# Patient Record
Sex: Female | Born: 1935
Health system: Southern US, Community
[De-identification: ages and names within clinical notes are randomized; demographics above are authoritative.]

## PROBLEM LIST (undated history)

## (undated) DIAGNOSIS — E0500 Thyrotoxicosis with diffuse goiter without thyrotoxic crisis or storm: Secondary | ICD-10-CM

## (undated) DIAGNOSIS — E039 Hypothyroidism, unspecified: Secondary | ICD-10-CM

## (undated) DIAGNOSIS — K219 Gastro-esophageal reflux disease without esophagitis: Secondary | ICD-10-CM

## (undated) DIAGNOSIS — I4891 Unspecified atrial fibrillation: Secondary | ICD-10-CM

## (undated) DIAGNOSIS — N200 Calculus of kidney: Secondary | ICD-10-CM

## (undated) HISTORY — PX: TOTAL ABDOMINAL HYSTERECTOMY: SHX209

## (undated) HISTORY — DX: Hypothyroidism, unspecified: E03.9

## (undated) HISTORY — DX: Thyrotoxicosis with diffuse goiter without thyrotoxic crisis or storm: E05.00

## (undated) HISTORY — DX: Gastro-esophageal reflux disease without esophagitis: K21.9

---

## 2005-09-22 ENCOUNTER — Ambulatory Visit: Payer: Self-pay | Admitting: Cardiology

## 2015-03-18 DIAGNOSIS — I10 Essential (primary) hypertension: Secondary | ICD-10-CM | POA: Diagnosis not present

## 2015-03-18 DIAGNOSIS — M7989 Other specified soft tissue disorders: Secondary | ICD-10-CM | POA: Diagnosis not present

## 2015-03-18 DIAGNOSIS — M810 Age-related osteoporosis without current pathological fracture: Secondary | ICD-10-CM | POA: Diagnosis not present

## 2015-03-18 DIAGNOSIS — E7800 Pure hypercholesterolemia, unspecified: Secondary | ICD-10-CM | POA: Diagnosis not present

## 2015-06-20 DIAGNOSIS — Z7189 Other specified counseling: Secondary | ICD-10-CM | POA: Diagnosis not present

## 2015-06-20 DIAGNOSIS — Z79899 Other long term (current) drug therapy: Secondary | ICD-10-CM | POA: Diagnosis not present

## 2015-06-20 DIAGNOSIS — E7800 Pure hypercholesterolemia, unspecified: Secondary | ICD-10-CM | POA: Diagnosis not present

## 2015-06-20 DIAGNOSIS — R5383 Other fatigue: Secondary | ICD-10-CM | POA: Diagnosis not present

## 2015-06-20 DIAGNOSIS — Z6823 Body mass index (BMI) 23.0-23.9, adult: Secondary | ICD-10-CM | POA: Diagnosis not present

## 2015-06-20 DIAGNOSIS — E039 Hypothyroidism, unspecified: Secondary | ICD-10-CM | POA: Diagnosis not present

## 2015-06-20 DIAGNOSIS — Z1389 Encounter for screening for other disorder: Secondary | ICD-10-CM | POA: Diagnosis not present

## 2015-06-20 DIAGNOSIS — Z1211 Encounter for screening for malignant neoplasm of colon: Secondary | ICD-10-CM | POA: Diagnosis not present

## 2015-06-20 DIAGNOSIS — Z0000 Encounter for general adult medical examination without abnormal findings: Secondary | ICD-10-CM | POA: Diagnosis not present

## 2015-06-20 DIAGNOSIS — Z299 Encounter for prophylactic measures, unspecified: Secondary | ICD-10-CM | POA: Diagnosis not present

## 2015-06-24 ENCOUNTER — Encounter (INDEPENDENT_AMBULATORY_CARE_PROVIDER_SITE_OTHER): Payer: Self-pay | Admitting: *Deleted

## 2015-07-17 ENCOUNTER — Encounter (INDEPENDENT_AMBULATORY_CARE_PROVIDER_SITE_OTHER): Payer: Self-pay | Admitting: Internal Medicine

## 2015-07-17 ENCOUNTER — Ambulatory Visit (INDEPENDENT_AMBULATORY_CARE_PROVIDER_SITE_OTHER): Payer: Medicare Other | Admitting: Internal Medicine

## 2015-07-17 ENCOUNTER — Encounter (INDEPENDENT_AMBULATORY_CARE_PROVIDER_SITE_OTHER): Payer: Self-pay

## 2015-07-17 VITALS — BP 112/80 | HR 60 | Temp 98.0°F | Ht 62.5 in | Wt 123.7 lb

## 2015-07-17 DIAGNOSIS — E0500 Thyrotoxicosis with diffuse goiter without thyrotoxic crisis or storm: Secondary | ICD-10-CM | POA: Insufficient documentation

## 2015-07-17 DIAGNOSIS — K219 Gastro-esophageal reflux disease without esophagitis: Secondary | ICD-10-CM | POA: Insufficient documentation

## 2015-07-17 DIAGNOSIS — R1310 Dysphagia, unspecified: Secondary | ICD-10-CM

## 2015-07-17 MED ORDER — OMEPRAZOLE 40 MG PO CPDR: 40.0000 mg | DELAYED_RELEASE_CAPSULE | Freq: Every day | ORAL | Status: DC

## 2015-07-17 NOTE — Patient Instructions (Addendum)
DG esophagram.  GERD diet given to patient
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
## 2015-07-19 ENCOUNTER — Encounter (INDEPENDENT_AMBULATORY_CARE_PROVIDER_SITE_OTHER): Payer: Self-pay

## 2015-07-19 DIAGNOSIS — K219 Gastro-esophageal reflux disease without esophagitis: Secondary | ICD-10-CM | POA: Diagnosis not present

## 2015-07-19 DIAGNOSIS — R1310 Dysphagia, unspecified: Secondary | ICD-10-CM | POA: Diagnosis not present

## 2015-07-19 DIAGNOSIS — K449 Diaphragmatic hernia without obstruction or gangrene: Secondary | ICD-10-CM | POA: Diagnosis not present

## 2015-08-06 ENCOUNTER — Other Ambulatory Visit (INDEPENDENT_AMBULATORY_CARE_PROVIDER_SITE_OTHER): Payer: Self-pay | Admitting: Internal Medicine

## 2015-08-06 ENCOUNTER — Telehealth (INDEPENDENT_AMBULATORY_CARE_PROVIDER_SITE_OTHER): Payer: Self-pay | Admitting: Internal Medicine

## 2015-08-06 ENCOUNTER — Encounter (INDEPENDENT_AMBULATORY_CARE_PROVIDER_SITE_OTHER): Payer: Self-pay | Admitting: *Deleted

## 2015-08-06 DIAGNOSIS — K219 Gastro-esophageal reflux disease without esophagitis: Secondary | ICD-10-CM

## 2015-08-06 NOTE — Telephone Encounter (Signed)
Still burning in her esophagus. Normal Esophram.

## 2015-08-06 NOTE — Telephone Encounter (Signed)
EGD sch'd 08/23/15, left detailed message for patient, instructions mailed

## 2015-08-06 NOTE — Telephone Encounter (Signed)
Need EGD

## 2015-08-13 ENCOUNTER — Encounter (INDEPENDENT_AMBULATORY_CARE_PROVIDER_SITE_OTHER): Payer: Self-pay

## 2015-08-23 ENCOUNTER — Encounter (HOSPITAL_COMMUNITY): Payer: Self-pay | Admitting: *Deleted

## 2015-08-23 ENCOUNTER — Encounter (HOSPITAL_COMMUNITY)
Admission: RE | Disposition: A | Discharge status: Home / Self Care | Payer: Self-pay | Source: Ambulatory Visit | Attending: Internal Medicine

## 2015-08-23 ENCOUNTER — Ambulatory Visit (HOSPITAL_COMMUNITY)
Admission: RE | Admit: 2015-08-23 | Discharge: 2015-08-23 | Disposition: A | Discharge status: Home / Self Care | Payer: Medicare Other | Source: Ambulatory Visit | Attending: Internal Medicine | Admitting: Internal Medicine

## 2015-08-23 DIAGNOSIS — E0500 Thyrotoxicosis with diffuse goiter without thyrotoxic crisis or storm: Secondary | ICD-10-CM | POA: Insufficient documentation

## 2015-08-23 DIAGNOSIS — Z880 Allergy status to penicillin: Secondary | ICD-10-CM | POA: Insufficient documentation

## 2015-08-23 DIAGNOSIS — Z90710 Acquired absence of both cervix and uterus: Secondary | ICD-10-CM | POA: Insufficient documentation

## 2015-08-23 DIAGNOSIS — K219 Gastro-esophageal reflux disease without esophagitis: Secondary | ICD-10-CM

## 2015-08-23 DIAGNOSIS — K210 Gastro-esophageal reflux disease with esophagitis: Secondary | ICD-10-CM | POA: Diagnosis not present

## 2015-08-23 DIAGNOSIS — R1310 Dysphagia, unspecified: Secondary | ICD-10-CM | POA: Insufficient documentation

## 2015-08-23 DIAGNOSIS — K449 Diaphragmatic hernia without obstruction or gangrene: Secondary | ICD-10-CM | POA: Diagnosis not present

## 2015-08-23 DIAGNOSIS — Z87442 Personal history of urinary calculi: Secondary | ICD-10-CM | POA: Diagnosis not present

## 2015-08-23 DIAGNOSIS — E039 Hypothyroidism, unspecified: Secondary | ICD-10-CM | POA: Insufficient documentation

## 2015-08-23 DIAGNOSIS — Z79899 Other long term (current) drug therapy: Secondary | ICD-10-CM | POA: Diagnosis not present

## 2015-08-23 DIAGNOSIS — Z882 Allergy status to sulfonamides status: Secondary | ICD-10-CM | POA: Diagnosis not present

## 2015-08-23 DIAGNOSIS — K317 Polyp of stomach and duodenum: Secondary | ICD-10-CM | POA: Diagnosis not present

## 2015-08-23 HISTORY — DX: Calculus of kidney: N20.0

## 2015-08-23 HISTORY — PX: ESOPHAGOGASTRODUODENOSCOPY: SHX5428

## 2015-08-23 SURGERY — EGD (ESOPHAGOGASTRODUODENOSCOPY)
Anesthesia: Moderate Sedation

## 2015-08-23 MED ORDER — MEPERIDINE HCL 50 MG/ML IJ SOLN
INTRAMUSCULAR | Status: DC | PRN
  Administered 2015-08-23 (×2): 25 mg via INTRAVENOUS

## 2015-08-23 MED ORDER — BUTAMBEN-TETRACAINE-BENZOCAINE 2-2-14 % EX AERO
INHALATION_SPRAY | CUTANEOUS | Status: DC | PRN
  Administered 2015-08-23: 2 via TOPICAL

## 2015-08-23 MED ORDER — MIDAZOLAM HCL 5 MG/5ML IJ SOLN
INTRAMUSCULAR | Status: DC | PRN
  Administered 2015-08-23: 1 mg via INTRAVENOUS
  Administered 2015-08-23: 2 mg via INTRAVENOUS
  Administered 2015-08-23 (×2): 1 mg via INTRAVENOUS

## 2015-08-23 MED ORDER — SODIUM CHLORIDE 0.9 % IV SOLN
INTRAVENOUS | Status: DC
  Administered 2015-08-23: 14:00:00 via INTRAVENOUS

## 2015-08-23 MED ORDER — MIDAZOLAM HCL 5 MG/5ML IJ SOLN
INTRAMUSCULAR | Status: AC
  Filled 2015-08-23: qty 10

## 2015-08-23 MED ORDER — PANTOPRAZOLE SODIUM 40 MG PO TBEC: 40.0000 mg | DELAYED_RELEASE_TABLET | Freq: Two times a day (BID) | ORAL | Status: DC

## 2015-08-23 MED ORDER — STERILE WATER FOR IRRIGATION IR SOLN
Status: DC | PRN
  Administered 2015-08-23: 16:00:00

## 2015-08-23 MED ORDER — MEPERIDINE HCL 50 MG/ML IJ SOLN
INTRAMUSCULAR | Status: AC
  Filled 2015-08-23: qty 1

## 2015-08-23 NOTE — H&P (Signed)
Jon GillsJean R Bueso is an 81 y.o. female.   Chief Complaint: Patient is here for EGD and possible EGD. HPI: She does 81 year old Caucasian female was at more than 5 year history of symptoms of GERD and has been using OTC medications until a few months ago when she started this Nexium. She is still having intermittent heartburn she also has dysphagia with solids. She denies nausea vomiting abdominal pain or melena. She has good appetite. She had barium study on 07/19/2015 and no abnormality was identified.  Past Medical History  Diagnosis Date  . GERD (gastroesophageal reflux disease)   . Graves disease   . Hypothyroid   . Kidney stones     Past Surgical History  Procedure Laterality Date  . Total abdominal hysterectomy      History reviewed. No pertinent family history. Social History:  reports that she has never smoked. She does not have any smokeless tobacco history on file. She reports that she does not drink alcohol or use illicit drugs.  Allergies:  Allergies  Allergen Reactions  . Penicillins     Rash, arm swelled  . Sulfa Antibiotics     Rash     Medications Prior to Admission  Medication Sig Dispense Refill  . calcium carbonate (OS-CAL) 1250 (500 Ca) MG chewable tablet Chew 1 tablet by mouth daily.    Marland Kitchen. levothyroxine (SYNTHROID) 75 MCG tablet Take 75 mcg by mouth daily before breakfast.    . multivitamin-iron-minerals-folic acid (CENTRUM) chewable tablet Chew 1 tablet by mouth daily.    Marland Kitchen. omeprazole (PRILOSEC) 40 MG capsule Take 1 capsule (40 mg total) by mouth daily. 90 capsule 3  . esomeprazole (NEXIUM) 20 MG capsule Take 22.3 mg by mouth as needed.      No results found for this or any previous visit (from the past 48 hour(s)). No results found.  ROS  Blood pressure 180/85, pulse 80, temperature 98.2 F (36.8 C), temperature source Oral, resp. rate 23, height 5' 2.5" (1.588 m), weight 124 lb (56.246 kg), SpO2 99 %. Physical Exam  Constitutional: She appears  well-developed and well-nourished.  HENT:  Mouth/Throat: Oropharynx is clear and moist.  Eyes: Conjunctivae are normal. No scleral icterus.  Neck: No thyromegaly present.  Cardiovascular: Normal rate, regular rhythm and normal heart sounds.   No murmur heard. Respiratory: Effort normal and breath sounds normal.  GI: Soft. She exhibits no distension and no mass. There is no tenderness.  Musculoskeletal: She exhibits no edema.  Lymphadenopathy:    She has no cervical adenopathy.  Neurological: She is alert.  Skin: Skin is warm.     Assessment/Plan Chronic GERD with recent flareup of her symptoms and solid food dysphagia. EGD and possible ED.  Lionel DecemberNajeeb Brittni Hult, MD 08/23/2015, 3:27 PM

## 2015-08-23 NOTE — Op Note (Signed)
[facility] Patient Name: Bethany CradleJean Badgett Procedure Date: 08/23/2015 3:23 PM MRN: [id number] Date of Birth: [birth year] Attending MD: Lionel DecemberNajeeb Mercia Dowe , MD CSN: [id number] Age: 81 Admit Type: Outpatient Procedure:                Upper GI endoscopy Indications:              Suspected gastro-esophageal reflux disease, Failure                            to respond to medical treatment Providers:                Lionel DecemberNajeeb Tahje Borawski, MD, Loma MessingLurae B. Patsy LagerAlbert RN, RN, Marlene Lardaylor                            B. Lemons, Technician Referring MD:             Ignatius Speckinghruv B. Vyas, MD Medicines:                Cetacaine spray, Meperidine 50 mg IV, Midazolam 5                            mg IV Complications:            No immediate complications. Estimated Blood Loss:     Estimated blood loss was minimal. Procedure:                Pre-Anesthesia Assessment:                           - Prior to the procedure, a History and Physical                            was performed, and patient medications and                            allergies were reviewed. The patient's tolerance of                            previous anesthesia was also reviewed. The risks                            and benefits of the procedure and the sedation                            options and risks were discussed with the patient.                            All questions were answered, and informed consent                            was obtained. Prior Anticoagulants: The patient has                            taken no previous anticoagulant or antiplatelet  agents. ASA Grade Assessment: II - A patient with                            mild systemic disease. After reviewing the risks                            and benefits, the patient was deemed in                            satisfactory condition to undergo the procedure.                           After obtaining informed consent, the endoscope was                             passed under direct vision. Throughout the                            procedure, the patient's blood pressure, pulse, and                            oxygen saturations were monitored continuously. The                            [id number] ([id number] scope was introduced through the                            mouth, and advanced to the second part of duodenum.                            The upper GI endoscopy was accomplished without                            difficulty. The patient tolerated the procedure                            well. Scope In: 3:37:50 PM Scope Out: 3:50:49 PM Total Procedure Duration: 0 hours 12 minutes 59 seconds  Findings:      The upper third of the esophagus and middle third of the esophagus were       normal.      LA Grade B (one or more mucosal breaks greater than 5 mm, not extending       between the tops of two mucosal folds) esophagitis with no bleeding was       found 31 to 33 cm from the incisors.      The Z-line was irregular and was found 33 cm from the incisors. Biopsies       were taken with a cold forceps for histology.      Small sliding hiatal hernia.      A few small sessile polyps with no bleeding and no stigmata of recent       bleeding were found in the gastric fundus and in the gastric body.       Biopsies were taken with a cold forceps for histology.  The exam of the stomach was otherwise normal.      The duodenal bulb and second portion of the duodenum were normal. Impression:               - Normal upper third of esophagus and middle third                            of esophagus.                           - LA Grade B reflux esophagitis.                           - Z-line irregular, 33 cm from the incisors.                            Biopsied.                           - Small sliding hiatal hernia.                           - A few gastric polyps. Biopsied.                           - Normal duodenal bulb and second portion of the                             duodenum. Moderate Sedation:      Moderate (conscious) sedation was administered by the endoscopy nurse       and supervised by the endoscopist. The following parameters were       monitored: oxygen saturation, heart rate, blood pressure, CO2       capnography and response to care. Total physician intraservice time was       18 minutes. Recommendation:           - Patient has a contact number available for                            emergencies. The signs and symptoms of potential                            delayed complications were discussed with the                            patient. Return to normal activities tomorrow.                            Written discharge instructions were provided to the                            patient.                           - Resume previous diet today.                           -  Continue present medications.                           - Use Protonix (pantoprazole) 40 mg PO BID today.                           - Await pathology results.                           - Return to GI office in 2 months. Procedure Code(s):        --- Professional ---                           [phone], Esophagogastroduodenoscopy, flexible,                            transoral; with biopsy, single or multiple                           99152, Moderate sedation services provided by the                            same physician or other qualified health care                            professional performing the diagnostic or                            therapeutic service that the sedation supports,                            requiring the presence of an independent trained                            observer to assist in the monitoring of the                            patient's level of consciousness and physiological                            status; initial 15 minutes of intraservice time,                            patient age 32 years or  older Diagnosis Code(s):        --- Professional ---                           K21.0, Gastro-esophageal reflux disease with                            esophagitis                           K22.8, Other specified diseases of esophagus  K31.7, Polyp of stomach and duodenum CPT copyright 2016 American Medical Association. All rights reserved. The codes documented in this report are preliminary and upon coder review may  be revised to meet current compliance requirements. Lionel December, MD Lionel December, MD 08/23/2015 4:02:07 PM This report has been signed electronically. Number of Addenda: 0

## 2015-08-23 NOTE — Discharge Instructions (Signed)
Discontinue Nexium. Pantoprazole 40 mg by mouth 30 minutes before breakfast and evening meal daily. Resume other medications and diet as before. No driving for 24 hours. Physician will call with biopsy results. Office visit in 2 months.     Esophagogastroduodenoscopy, Care After Refer to this sheet in the next few weeks. These instructions provide you with information about caring for yourself after your procedure. Your health care provider may also give you more specific instructions. Your treatment has been planned according to current medical practices, but problems sometimes occur. Call your health care provider if you have any problems or questions after your procedure. WHAT TO EXPECT AFTER THE PROCEDURE After your procedure, it is typical to feel:  Soreness in your throat.  Pain with swallowing.  Sick to your stomach (nauseous).  Bloated.  Dizzy.  Fatigued. HOME CARE INSTRUCTIONS  Do not eat or drink anything until the numbing medicine (local anesthetic) has worn off and your gag reflex has returned. You will know that the local anesthetic has worn off when you can swallow comfortably.  Do not drive or operate machinery until directed by your health care provider.  Take medicines only as directed by your health care provider. SEEK MEDICAL CARE IF:   You cannot stop coughing.  You are not urinating at all or less than usual. SEEK IMMEDIATE MEDICAL CARE IF:  You have difficulty swallowing.  You cannot eat or drink.  You have worsening throat or chest pain.  You have dizziness or lightheadedness or you faint.  You have nausea or vomiting.  You have chills.  You have a fever.  You have severe abdominal pain.  You have black, tarry, or bloody stools.   This information is not intended to replace advice given to you by your health care provider. Make sure you discuss any questions you have with your health care provider.   Document Released: 01/20/2012  Document Revised: 02/23/2014 Document Reviewed: 01/20/2012 Elsevier Interactive Patient Education [employer]2016 Elsevier Inc.

## 2015-08-26 ENCOUNTER — Encounter (INDEPENDENT_AMBULATORY_CARE_PROVIDER_SITE_OTHER): Payer: Self-pay | Admitting: Internal Medicine

## 2015-08-29 ENCOUNTER — Encounter (HOSPITAL_COMMUNITY): Payer: Self-pay | Admitting: Internal Medicine

## 2015-09-30 DIAGNOSIS — H40053 Ocular hypertension, bilateral: Secondary | ICD-10-CM | POA: Diagnosis not present

## 2015-10-10 DIAGNOSIS — H2513 Age-related nuclear cataract, bilateral: Secondary | ICD-10-CM | POA: Diagnosis not present

## 2015-10-10 DIAGNOSIS — H25013 Cortical age-related cataract, bilateral: Secondary | ICD-10-CM | POA: Diagnosis not present

## 2015-10-10 DIAGNOSIS — H2512 Age-related nuclear cataract, left eye: Secondary | ICD-10-CM | POA: Diagnosis not present

## 2015-10-10 DIAGNOSIS — H25012 Cortical age-related cataract, left eye: Secondary | ICD-10-CM | POA: Diagnosis not present

## 2015-10-10 DIAGNOSIS — H35363 Drusen (degenerative) of macula, bilateral: Secondary | ICD-10-CM | POA: Diagnosis not present

## 2015-10-10 DIAGNOSIS — H35033 Hypertensive retinopathy, bilateral: Secondary | ICD-10-CM | POA: Diagnosis not present

## 2015-10-28 ENCOUNTER — Encounter (INDEPENDENT_AMBULATORY_CARE_PROVIDER_SITE_OTHER): Payer: Self-pay | Admitting: Internal Medicine

## 2015-10-28 ENCOUNTER — Ambulatory Visit (INDEPENDENT_AMBULATORY_CARE_PROVIDER_SITE_OTHER): Payer: Medicare Other | Admitting: Internal Medicine

## 2015-10-28 VITALS — BP 160/68 | HR 72 | Temp 98.0°F | Ht 62.5 in | Wt 124.8 lb

## 2015-10-28 DIAGNOSIS — K2100 Gastro-esophageal reflux disease with esophagitis, without bleeding: Secondary | ICD-10-CM

## 2015-10-28 NOTE — Patient Instructions (Signed)
Continue Protonix twice a day. OV in 1year.

## 2015-10-28 NOTE — Progress Notes (Signed)
Subjective:    Patient ID: Bethany GillsJean R Farinas, female    DOB: [birth year], 81 y.o.   MRN: [id number]  HPI Here today for f/u after recent EGD in July for GERD. She tells me she is doing good. The Protonix has helped. She is taking BID. Appetite is good. No weight loss. She has a BM daily. No melena or BRRB. Last colonoscopy in 2010 for family hx of colon cancer (sister in her 5680s).  She has declined colonoscopy today. She is going to have cataract surgery and then she will let us know when she is ready.   08/23/2015 EGD: GERD Impression:               - Normal upper third of esophagus and middle third                            of esophagus.                           - LA Grade B reflux esophagitis.                           - Z-line irregular, 33 cm from the incisors.                            Biopsied.                           - Small sliding hiatal hernia.                           - A few gastric polyps. Biopsied.                           - Normal duodenal bulb and second portion of the                            duodenum. Moderate Sedation:  Review of Systems Past Medical History:  Diagnosis Date  . GERD (gastroesophageal reflux disease)   . Graves disease   . Hypothyroid   . Kidney stones     Past Surgical History:  Procedure Laterality Date  . ESOPHAGOGASTRODUODENOSCOPY N/A 08/23/2015   Procedure: ESOPHAGOGASTRODUODENOSCOPY (EGD);  Surgeon: Malissa HippoNajeeb U Rehman, MD;  Location: [facility];  Service: Endoscopy;  Laterality: N/A;  235-moved to 220 Ann to notify pt  . TOTAL ABDOMINAL HYSTERECTOMY      Allergies  Allergen Reactions  . Penicillins     Rash, arm swelled  . Sulfa Antibiotics     Rash     Current Outpatient Prescriptions on File Prior to Visit  Medication Sig Dispense Refill  . calcium carbonate (OS-CAL) 1250 (500 Ca) MG chewable tablet Chew 1 tablet by mouth daily.    Marland Kitchen. levothyroxine (SYNTHROID) 75 MCG tablet Take 75 mcg by mouth daily before breakfast.     . multivitamin-iron-minerals-folic acid (CENTRUM) chewable tablet Chew 1 tablet by mouth daily.    . pantoprazole (PROTONIX) 40 MG tablet Take 1 tablet (40 mg total) by mouth 2 (two) times daily before a meal. 60 tablet 2   No current facility-administered medications on file prior to visit.  Objective:   Physical Exam  Alert and oriented. Skin warm and dry. Oral mucosa is moist.   . Sclera anicteric, conjunctivae is pink. Thyroid not enlarged. No cervical lymphadenopathy. Lungs clear. Heart regular rate and rhythm.  Abdomen is soft. Bowel sounds are positive. No hepatomegaly. No abdominal masses felt. No tenderness.  No edema to lower extremities. P .      Assessment & Plan:  GERD with Esophagitis. Continue the Protonix BID.  OV in 1 year.

## 2015-11-05 DIAGNOSIS — H25812 Combined forms of age-related cataract, left eye: Secondary | ICD-10-CM | POA: Diagnosis not present

## 2015-11-05 DIAGNOSIS — H2512 Age-related nuclear cataract, left eye: Secondary | ICD-10-CM | POA: Diagnosis not present

## 2015-11-05 DIAGNOSIS — H25012 Cortical age-related cataract, left eye: Secondary | ICD-10-CM | POA: Diagnosis not present

## 2015-11-27 DIAGNOSIS — H2511 Age-related nuclear cataract, right eye: Secondary | ICD-10-CM | POA: Diagnosis not present

## 2015-11-27 DIAGNOSIS — H25011 Cortical age-related cataract, right eye: Secondary | ICD-10-CM | POA: Diagnosis not present

## 2015-12-03 DIAGNOSIS — H2511 Age-related nuclear cataract, right eye: Secondary | ICD-10-CM | POA: Diagnosis not present

## 2015-12-03 DIAGNOSIS — H25011 Cortical age-related cataract, right eye: Secondary | ICD-10-CM | POA: Diagnosis not present

## 2015-12-03 DIAGNOSIS — H25811 Combined forms of age-related cataract, right eye: Secondary | ICD-10-CM | POA: Diagnosis not present

## 2015-12-05 DIAGNOSIS — H5989 Other postprocedural complications and disorders of eye and adnexa, not elsewhere classified: Secondary | ICD-10-CM | POA: Diagnosis not present

## 2015-12-05 DIAGNOSIS — H2000 Unspecified acute and subacute iridocyclitis: Secondary | ICD-10-CM | POA: Diagnosis not present

## 2015-12-07 DIAGNOSIS — H2000 Unspecified acute and subacute iridocyclitis: Secondary | ICD-10-CM | POA: Diagnosis not present

## 2015-12-07 DIAGNOSIS — Z961 Presence of intraocular lens: Secondary | ICD-10-CM | POA: Diagnosis not present

## 2015-12-07 DIAGNOSIS — H5989 Other postprocedural complications and disorders of eye and adnexa, not elsewhere classified: Secondary | ICD-10-CM | POA: Diagnosis not present

## 2015-12-12 DIAGNOSIS — H44001 Unspecified purulent endophthalmitis, right eye: Secondary | ICD-10-CM | POA: Diagnosis not present

## 2015-12-12 DIAGNOSIS — H44011 Panophthalmitis (acute), right eye: Secondary | ICD-10-CM | POA: Diagnosis not present

## 2015-12-12 DIAGNOSIS — H2000 Unspecified acute and subacute iridocyclitis: Secondary | ICD-10-CM | POA: Diagnosis not present

## 2015-12-12 DIAGNOSIS — Z961 Presence of intraocular lens: Secondary | ICD-10-CM | POA: Diagnosis not present

## 2015-12-12 DIAGNOSIS — H5989 Other postprocedural complications and disorders of eye and adnexa, not elsewhere classified: Secondary | ICD-10-CM | POA: Diagnosis not present

## 2016-07-23 DIAGNOSIS — K219 Gastro-esophageal reflux disease without esophagitis: Secondary | ICD-10-CM | POA: Diagnosis not present

## 2016-07-23 DIAGNOSIS — R609 Edema, unspecified: Secondary | ICD-10-CM | POA: Diagnosis not present

## 2016-07-23 DIAGNOSIS — Z0000 Encounter for general adult medical examination without abnormal findings: Secondary | ICD-10-CM | POA: Diagnosis not present

## 2016-07-23 DIAGNOSIS — Z1211 Encounter for screening for malignant neoplasm of colon: Secondary | ICD-10-CM | POA: Diagnosis not present

## 2016-07-23 DIAGNOSIS — Z7189 Other specified counseling: Secondary | ICD-10-CM | POA: Diagnosis not present

## 2016-07-23 DIAGNOSIS — Z1389 Encounter for screening for other disorder: Secondary | ICD-10-CM | POA: Diagnosis not present

## 2016-07-23 DIAGNOSIS — Z79899 Other long term (current) drug therapy: Secondary | ICD-10-CM | POA: Diagnosis not present

## 2016-07-23 DIAGNOSIS — E039 Hypothyroidism, unspecified: Secondary | ICD-10-CM | POA: Diagnosis not present

## 2016-07-23 DIAGNOSIS — E663 Overweight: Secondary | ICD-10-CM | POA: Diagnosis not present

## 2016-07-23 DIAGNOSIS — Z299 Encounter for prophylactic measures, unspecified: Secondary | ICD-10-CM | POA: Diagnosis not present

## 2016-07-23 DIAGNOSIS — I10 Essential (primary) hypertension: Secondary | ICD-10-CM | POA: Diagnosis not present

## 2016-07-23 DIAGNOSIS — E7800 Pure hypercholesterolemia, unspecified: Secondary | ICD-10-CM | POA: Diagnosis not present

## 2016-08-13 DIAGNOSIS — E2839 Other primary ovarian failure: Secondary | ICD-10-CM | POA: Diagnosis not present

## 2016-08-24 DIAGNOSIS — E663 Overweight: Secondary | ICD-10-CM | POA: Diagnosis not present

## 2016-08-24 DIAGNOSIS — E039 Hypothyroidism, unspecified: Secondary | ICD-10-CM | POA: Diagnosis not present

## 2016-08-24 DIAGNOSIS — I10 Essential (primary) hypertension: Secondary | ICD-10-CM | POA: Diagnosis not present

## 2016-08-24 DIAGNOSIS — Z299 Encounter for prophylactic measures, unspecified: Secondary | ICD-10-CM | POA: Diagnosis not present

## 2016-08-24 DIAGNOSIS — L0390 Cellulitis, unspecified: Secondary | ICD-10-CM | POA: Diagnosis not present

## 2016-08-24 DIAGNOSIS — E7800 Pure hypercholesterolemia, unspecified: Secondary | ICD-10-CM | POA: Diagnosis not present

## 2016-09-08 DIAGNOSIS — E039 Hypothyroidism, unspecified: Secondary | ICD-10-CM | POA: Diagnosis not present

## 2016-09-08 DIAGNOSIS — L0390 Cellulitis, unspecified: Secondary | ICD-10-CM | POA: Diagnosis not present

## 2016-09-08 DIAGNOSIS — Z299 Encounter for prophylactic measures, unspecified: Secondary | ICD-10-CM | POA: Diagnosis not present

## 2016-09-08 DIAGNOSIS — E7800 Pure hypercholesterolemia, unspecified: Secondary | ICD-10-CM | POA: Diagnosis not present

## 2016-09-08 DIAGNOSIS — E663 Overweight: Secondary | ICD-10-CM | POA: Diagnosis not present

## 2016-09-08 DIAGNOSIS — I10 Essential (primary) hypertension: Secondary | ICD-10-CM | POA: Diagnosis not present

## 2016-09-14 DIAGNOSIS — M79606 Pain in leg, unspecified: Secondary | ICD-10-CM | POA: Diagnosis not present

## 2016-09-14 DIAGNOSIS — M79605 Pain in left leg: Secondary | ICD-10-CM | POA: Diagnosis not present

## 2016-09-22 DIAGNOSIS — Z299 Encounter for prophylactic measures, unspecified: Secondary | ICD-10-CM | POA: Diagnosis not present

## 2016-09-22 DIAGNOSIS — L03119 Cellulitis of unspecified part of limb: Secondary | ICD-10-CM | POA: Diagnosis not present

## 2016-09-22 DIAGNOSIS — Z6826 Body mass index (BMI) 26.0-26.9, adult: Secondary | ICD-10-CM | POA: Diagnosis not present

## 2016-09-22 DIAGNOSIS — E7800 Pure hypercholesterolemia, unspecified: Secondary | ICD-10-CM | POA: Diagnosis not present

## 2016-09-22 DIAGNOSIS — E039 Hypothyroidism, unspecified: Secondary | ICD-10-CM | POA: Diagnosis not present

## 2016-09-22 DIAGNOSIS — I10 Essential (primary) hypertension: Secondary | ICD-10-CM | POA: Diagnosis not present

## 2016-10-13 ENCOUNTER — Encounter (INDEPENDENT_AMBULATORY_CARE_PROVIDER_SITE_OTHER): Payer: Self-pay | Admitting: Internal Medicine

## 2016-10-27 ENCOUNTER — Ambulatory Visit (INDEPENDENT_AMBULATORY_CARE_PROVIDER_SITE_OTHER): Payer: Medicare Other | Admitting: Internal Medicine

## 2016-10-27 DIAGNOSIS — L97829 Non-pressure chronic ulcer of other part of left lower leg with unspecified severity: Secondary | ICD-10-CM | POA: Diagnosis not present

## 2016-10-27 DIAGNOSIS — I83028 Varicose veins of left lower extremity with ulcer other part of lower leg: Secondary | ICD-10-CM | POA: Diagnosis not present

## 2016-10-27 DIAGNOSIS — Z79899 Other long term (current) drug therapy: Secondary | ICD-10-CM | POA: Diagnosis not present

## 2016-10-27 DIAGNOSIS — S8992XA Unspecified injury of left lower leg, initial encounter: Secondary | ICD-10-CM | POA: Diagnosis not present

## 2016-10-27 DIAGNOSIS — I83022 Varicose veins of left lower extremity with ulcer of calf: Secondary | ICD-10-CM | POA: Diagnosis not present

## 2016-10-27 DIAGNOSIS — R58 Hemorrhage, not elsewhere classified: Secondary | ICD-10-CM | POA: Diagnosis not present

## 2016-12-23 DIAGNOSIS — Z299 Encounter for prophylactic measures, unspecified: Secondary | ICD-10-CM | POA: Diagnosis not present

## 2016-12-23 DIAGNOSIS — Z6826 Body mass index (BMI) 26.0-26.9, adult: Secondary | ICD-10-CM | POA: Diagnosis not present

## 2016-12-23 DIAGNOSIS — L98499 Non-pressure chronic ulcer of skin of other sites with unspecified severity: Secondary | ICD-10-CM | POA: Diagnosis not present

## 2016-12-23 DIAGNOSIS — I10 Essential (primary) hypertension: Secondary | ICD-10-CM | POA: Diagnosis not present

## 2016-12-23 DIAGNOSIS — E039 Hypothyroidism, unspecified: Secondary | ICD-10-CM | POA: Diagnosis not present

## 2017-01-13 DIAGNOSIS — L98499 Non-pressure chronic ulcer of skin of other sites with unspecified severity: Secondary | ICD-10-CM | POA: Diagnosis not present

## 2017-01-13 DIAGNOSIS — E7800 Pure hypercholesterolemia, unspecified: Secondary | ICD-10-CM | POA: Diagnosis not present

## 2017-01-13 DIAGNOSIS — Z299 Encounter for prophylactic measures, unspecified: Secondary | ICD-10-CM | POA: Diagnosis not present

## 2017-01-13 DIAGNOSIS — Z2821 Immunization not carried out because of patient refusal: Secondary | ICD-10-CM | POA: Diagnosis not present

## 2017-01-13 DIAGNOSIS — Z6826 Body mass index (BMI) 26.0-26.9, adult: Secondary | ICD-10-CM | POA: Diagnosis not present

## 2017-01-13 DIAGNOSIS — I10 Essential (primary) hypertension: Secondary | ICD-10-CM | POA: Diagnosis not present

## 2017-01-19 DIAGNOSIS — I83813 Varicose veins of bilateral lower extremities with pain: Secondary | ICD-10-CM | POA: Diagnosis not present

## 2017-01-19 DIAGNOSIS — E079 Disorder of thyroid, unspecified: Secondary | ICD-10-CM | POA: Diagnosis not present

## 2017-01-19 DIAGNOSIS — L97829 Non-pressure chronic ulcer of other part of left lower leg with unspecified severity: Secondary | ICD-10-CM | POA: Diagnosis not present

## 2017-01-19 DIAGNOSIS — I83228 Varicose veins of left lower extremity with both ulcer of other part of lower extremity and inflammation: Secondary | ICD-10-CM | POA: Diagnosis not present

## 2017-01-19 DIAGNOSIS — Z23 Encounter for immunization: Secondary | ICD-10-CM | POA: Diagnosis not present

## 2017-01-19 DIAGNOSIS — Z79899 Other long term (current) drug therapy: Secondary | ICD-10-CM | POA: Diagnosis not present

## 2017-01-21 DIAGNOSIS — E039 Hypothyroidism, unspecified: Secondary | ICD-10-CM | POA: Diagnosis not present

## 2017-01-21 DIAGNOSIS — Z882 Allergy status to sulfonamides status: Secondary | ICD-10-CM | POA: Diagnosis not present

## 2017-01-21 DIAGNOSIS — Z880 Allergy status to penicillin: Secondary | ICD-10-CM | POA: Diagnosis not present

## 2017-01-21 DIAGNOSIS — Z79899 Other long term (current) drug therapy: Secondary | ICD-10-CM | POA: Diagnosis not present

## 2017-01-21 DIAGNOSIS — Z888 Allergy status to other drugs, medicaments and biological substances status: Secondary | ICD-10-CM | POA: Diagnosis not present

## 2017-01-21 DIAGNOSIS — I83028 Varicose veins of left lower extremity with ulcer other part of lower leg: Secondary | ICD-10-CM | POA: Diagnosis not present

## 2017-01-21 DIAGNOSIS — L97828 Non-pressure chronic ulcer of other part of left lower leg with other specified severity: Secondary | ICD-10-CM | POA: Diagnosis not present

## 2017-01-21 DIAGNOSIS — L97222 Non-pressure chronic ulcer of left calf with fat layer exposed: Secondary | ICD-10-CM | POA: Diagnosis not present

## 2017-01-21 DIAGNOSIS — I83022 Varicose veins of left lower extremity with ulcer of calf: Secondary | ICD-10-CM | POA: Diagnosis not present

## 2017-01-27 DIAGNOSIS — L97828 Non-pressure chronic ulcer of other part of left lower leg with other specified severity: Secondary | ICD-10-CM | POA: Diagnosis not present

## 2017-01-27 DIAGNOSIS — Z880 Allergy status to penicillin: Secondary | ICD-10-CM | POA: Diagnosis not present

## 2017-01-27 DIAGNOSIS — I83022 Varicose veins of left lower extremity with ulcer of calf: Secondary | ICD-10-CM | POA: Diagnosis not present

## 2017-01-27 DIAGNOSIS — E039 Hypothyroidism, unspecified: Secondary | ICD-10-CM | POA: Diagnosis not present

## 2017-01-27 DIAGNOSIS — Z882 Allergy status to sulfonamides status: Secondary | ICD-10-CM | POA: Diagnosis not present

## 2017-01-27 DIAGNOSIS — I83028 Varicose veins of left lower extremity with ulcer other part of lower leg: Secondary | ICD-10-CM | POA: Diagnosis not present

## 2017-01-27 DIAGNOSIS — Z79899 Other long term (current) drug therapy: Secondary | ICD-10-CM | POA: Diagnosis not present

## 2017-01-27 DIAGNOSIS — L97222 Non-pressure chronic ulcer of left calf with fat layer exposed: Secondary | ICD-10-CM | POA: Diagnosis not present

## 2017-02-04 DIAGNOSIS — Z880 Allergy status to penicillin: Secondary | ICD-10-CM | POA: Diagnosis not present

## 2017-02-04 DIAGNOSIS — I83022 Varicose veins of left lower extremity with ulcer of calf: Secondary | ICD-10-CM | POA: Diagnosis not present

## 2017-02-04 DIAGNOSIS — Z79899 Other long term (current) drug therapy: Secondary | ICD-10-CM | POA: Diagnosis not present

## 2017-02-04 DIAGNOSIS — E039 Hypothyroidism, unspecified: Secondary | ICD-10-CM | POA: Diagnosis not present

## 2017-02-04 DIAGNOSIS — Z882 Allergy status to sulfonamides status: Secondary | ICD-10-CM | POA: Diagnosis not present

## 2017-02-04 DIAGNOSIS — I83028 Varicose veins of left lower extremity with ulcer other part of lower leg: Secondary | ICD-10-CM | POA: Diagnosis not present

## 2017-02-04 DIAGNOSIS — L97222 Non-pressure chronic ulcer of left calf with fat layer exposed: Secondary | ICD-10-CM | POA: Diagnosis not present

## 2017-02-04 DIAGNOSIS — L97828 Non-pressure chronic ulcer of other part of left lower leg with other specified severity: Secondary | ICD-10-CM | POA: Diagnosis not present

## 2017-02-11 DIAGNOSIS — Z79899 Other long term (current) drug therapy: Secondary | ICD-10-CM | POA: Diagnosis not present

## 2017-02-11 DIAGNOSIS — L97828 Non-pressure chronic ulcer of other part of left lower leg with other specified severity: Secondary | ICD-10-CM | POA: Diagnosis not present

## 2017-02-11 DIAGNOSIS — Z882 Allergy status to sulfonamides status: Secondary | ICD-10-CM | POA: Diagnosis not present

## 2017-02-11 DIAGNOSIS — I83022 Varicose veins of left lower extremity with ulcer of calf: Secondary | ICD-10-CM | POA: Diagnosis not present

## 2017-02-11 DIAGNOSIS — Z880 Allergy status to penicillin: Secondary | ICD-10-CM | POA: Diagnosis not present

## 2017-02-11 DIAGNOSIS — E039 Hypothyroidism, unspecified: Secondary | ICD-10-CM | POA: Diagnosis not present

## 2017-02-11 DIAGNOSIS — L97222 Non-pressure chronic ulcer of left calf with fat layer exposed: Secondary | ICD-10-CM | POA: Diagnosis not present

## 2017-02-11 DIAGNOSIS — I83028 Varicose veins of left lower extremity with ulcer other part of lower leg: Secondary | ICD-10-CM | POA: Diagnosis not present

## 2017-02-17 DIAGNOSIS — L97829 Non-pressure chronic ulcer of other part of left lower leg with unspecified severity: Secondary | ICD-10-CM | POA: Diagnosis not present

## 2017-02-17 DIAGNOSIS — I87012 Postthrombotic syndrome with ulcer of left lower extremity: Secondary | ICD-10-CM | POA: Diagnosis not present

## 2017-02-25 DIAGNOSIS — L97222 Non-pressure chronic ulcer of left calf with fat layer exposed: Secondary | ICD-10-CM | POA: Diagnosis not present

## 2017-02-25 DIAGNOSIS — I87012 Postthrombotic syndrome with ulcer of left lower extremity: Secondary | ICD-10-CM | POA: Diagnosis not present

## 2017-02-25 DIAGNOSIS — L97829 Non-pressure chronic ulcer of other part of left lower leg with unspecified severity: Secondary | ICD-10-CM | POA: Diagnosis not present

## 2017-02-25 DIAGNOSIS — I83022 Varicose veins of left lower extremity with ulcer of calf: Secondary | ICD-10-CM | POA: Diagnosis not present

## 2017-03-04 DIAGNOSIS — I83022 Varicose veins of left lower extremity with ulcer of calf: Secondary | ICD-10-CM | POA: Diagnosis not present

## 2017-03-04 DIAGNOSIS — I87012 Postthrombotic syndrome with ulcer of left lower extremity: Secondary | ICD-10-CM | POA: Diagnosis not present

## 2017-03-04 DIAGNOSIS — L97829 Non-pressure chronic ulcer of other part of left lower leg with unspecified severity: Secondary | ICD-10-CM | POA: Diagnosis not present

## 2017-03-04 DIAGNOSIS — L97222 Non-pressure chronic ulcer of left calf with fat layer exposed: Secondary | ICD-10-CM | POA: Diagnosis not present

## 2017-05-25 DIAGNOSIS — I10 Essential (primary) hypertension: Secondary | ICD-10-CM | POA: Diagnosis not present

## 2017-05-25 DIAGNOSIS — Z6825 Body mass index (BMI) 25.0-25.9, adult: Secondary | ICD-10-CM | POA: Diagnosis not present

## 2017-05-25 DIAGNOSIS — Z299 Encounter for prophylactic measures, unspecified: Secondary | ICD-10-CM | POA: Diagnosis not present

## 2017-05-25 DIAGNOSIS — M810 Age-related osteoporosis without current pathological fracture: Secondary | ICD-10-CM | POA: Diagnosis not present

## 2017-05-25 DIAGNOSIS — R5383 Other fatigue: Secondary | ICD-10-CM | POA: Diagnosis not present

## 2017-05-25 DIAGNOSIS — R269 Unspecified abnormalities of gait and mobility: Secondary | ICD-10-CM | POA: Diagnosis not present

## 2017-05-25 DIAGNOSIS — F419 Anxiety disorder, unspecified: Secondary | ICD-10-CM | POA: Diagnosis not present

## 2017-05-25 DIAGNOSIS — E039 Hypothyroidism, unspecified: Secondary | ICD-10-CM | POA: Diagnosis not present

## 2017-05-25 DIAGNOSIS — E7800 Pure hypercholesterolemia, unspecified: Secondary | ICD-10-CM | POA: Diagnosis not present

## 2017-05-28 DIAGNOSIS — Z6825 Body mass index (BMI) 25.0-25.9, adult: Secondary | ICD-10-CM | POA: Diagnosis not present

## 2017-05-28 DIAGNOSIS — I10 Essential (primary) hypertension: Secondary | ICD-10-CM | POA: Diagnosis not present

## 2017-05-28 DIAGNOSIS — E039 Hypothyroidism, unspecified: Secondary | ICD-10-CM | POA: Diagnosis not present

## 2017-05-28 DIAGNOSIS — E559 Vitamin D deficiency, unspecified: Secondary | ICD-10-CM | POA: Diagnosis not present

## 2017-05-28 DIAGNOSIS — Z299 Encounter for prophylactic measures, unspecified: Secondary | ICD-10-CM | POA: Diagnosis not present

## 2017-05-28 DIAGNOSIS — E7800 Pure hypercholesterolemia, unspecified: Secondary | ICD-10-CM | POA: Diagnosis not present

## 2017-05-28 DIAGNOSIS — Z789 Other specified health status: Secondary | ICD-10-CM | POA: Diagnosis not present

## 2017-07-26 DIAGNOSIS — E7800 Pure hypercholesterolemia, unspecified: Secondary | ICD-10-CM | POA: Diagnosis not present

## 2017-07-26 DIAGNOSIS — Z7189 Other specified counseling: Secondary | ICD-10-CM | POA: Diagnosis not present

## 2017-07-26 DIAGNOSIS — Z1339 Encounter for screening examination for other mental health and behavioral disorders: Secondary | ICD-10-CM | POA: Diagnosis not present

## 2017-07-26 DIAGNOSIS — Z1331 Encounter for screening for depression: Secondary | ICD-10-CM | POA: Diagnosis not present

## 2017-07-26 DIAGNOSIS — I10 Essential (primary) hypertension: Secondary | ICD-10-CM | POA: Diagnosis not present

## 2017-07-26 DIAGNOSIS — Z299 Encounter for prophylactic measures, unspecified: Secondary | ICD-10-CM | POA: Diagnosis not present

## 2017-07-26 DIAGNOSIS — Z1211 Encounter for screening for malignant neoplasm of colon: Secondary | ICD-10-CM | POA: Diagnosis not present

## 2017-07-26 DIAGNOSIS — Z79899 Other long term (current) drug therapy: Secondary | ICD-10-CM | POA: Diagnosis not present

## 2017-07-26 DIAGNOSIS — E559 Vitamin D deficiency, unspecified: Secondary | ICD-10-CM | POA: Diagnosis not present

## 2017-07-26 DIAGNOSIS — Z6824 Body mass index (BMI) 24.0-24.9, adult: Secondary | ICD-10-CM | POA: Diagnosis not present

## 2017-07-26 DIAGNOSIS — Z0000 Encounter for general adult medical examination without abnormal findings: Secondary | ICD-10-CM | POA: Diagnosis not present

## 2017-07-26 DIAGNOSIS — E039 Hypothyroidism, unspecified: Secondary | ICD-10-CM | POA: Diagnosis not present

## 2017-10-05 DIAGNOSIS — E039 Hypothyroidism, unspecified: Secondary | ICD-10-CM | POA: Diagnosis not present

## 2018-01-25 DIAGNOSIS — I10 Essential (primary) hypertension: Secondary | ICD-10-CM | POA: Diagnosis not present

## 2018-01-25 DIAGNOSIS — E7800 Pure hypercholesterolemia, unspecified: Secondary | ICD-10-CM | POA: Diagnosis not present

## 2018-01-25 DIAGNOSIS — Z299 Encounter for prophylactic measures, unspecified: Secondary | ICD-10-CM | POA: Diagnosis not present

## 2018-01-25 DIAGNOSIS — Z6824 Body mass index (BMI) 24.0-24.9, adult: Secondary | ICD-10-CM | POA: Diagnosis not present

## 2018-01-25 DIAGNOSIS — Z2821 Immunization not carried out because of patient refusal: Secondary | ICD-10-CM | POA: Diagnosis not present

## 2018-01-25 DIAGNOSIS — E039 Hypothyroidism, unspecified: Secondary | ICD-10-CM | POA: Diagnosis not present

## 2018-03-08 DIAGNOSIS — H26493 Other secondary cataract, bilateral: Secondary | ICD-10-CM | POA: Diagnosis not present

## 2018-03-18 DIAGNOSIS — Z6824 Body mass index (BMI) 24.0-24.9, adult: Secondary | ICD-10-CM | POA: Diagnosis not present

## 2018-03-18 DIAGNOSIS — I10 Essential (primary) hypertension: Secondary | ICD-10-CM | POA: Diagnosis not present

## 2018-03-18 DIAGNOSIS — Z299 Encounter for prophylactic measures, unspecified: Secondary | ICD-10-CM | POA: Diagnosis not present

## 2018-03-18 DIAGNOSIS — J069 Acute upper respiratory infection, unspecified: Secondary | ICD-10-CM | POA: Diagnosis not present

## 2018-03-18 DIAGNOSIS — Z789 Other specified health status: Secondary | ICD-10-CM | POA: Diagnosis not present

## 2018-03-18 DIAGNOSIS — E7800 Pure hypercholesterolemia, unspecified: Secondary | ICD-10-CM | POA: Diagnosis not present

## 2018-03-18 DIAGNOSIS — L98499 Non-pressure chronic ulcer of skin of other sites with unspecified severity: Secondary | ICD-10-CM | POA: Diagnosis not present

## 2018-04-01 DIAGNOSIS — H26493 Other secondary cataract, bilateral: Secondary | ICD-10-CM | POA: Diagnosis not present

## 2018-04-01 DIAGNOSIS — H35033 Hypertensive retinopathy, bilateral: Secondary | ICD-10-CM | POA: Diagnosis not present

## 2018-04-01 DIAGNOSIS — H35363 Drusen (degenerative) of macula, bilateral: Secondary | ICD-10-CM | POA: Diagnosis not present

## 2018-04-01 DIAGNOSIS — H35371 Puckering of macula, right eye: Secondary | ICD-10-CM | POA: Diagnosis not present

## 2018-06-13 ENCOUNTER — Other Ambulatory Visit: Payer: Self-pay

## 2018-06-13 ENCOUNTER — Emergency Department (HOSPITAL_COMMUNITY): Payer: Medicare Other

## 2018-06-13 ENCOUNTER — Emergency Department (HOSPITAL_COMMUNITY)
Admission: EM | Admit: 2018-06-13 | Discharge: 2018-06-13 | Disposition: A | Discharge status: Home / Self Care | Payer: Medicare Other | Attending: Emergency Medicine | Admitting: Emergency Medicine

## 2018-06-13 ENCOUNTER — Encounter (HOSPITAL_COMMUNITY): Payer: Self-pay | Admitting: Emergency Medicine

## 2018-06-13 DIAGNOSIS — R109 Unspecified abdominal pain: Secondary | ICD-10-CM | POA: Diagnosis not present

## 2018-06-13 DIAGNOSIS — R102 Pelvic and perineal pain: Secondary | ICD-10-CM | POA: Insufficient documentation

## 2018-06-13 DIAGNOSIS — Z713 Dietary counseling and surveillance: Secondary | ICD-10-CM | POA: Diagnosis not present

## 2018-06-13 DIAGNOSIS — I10 Essential (primary) hypertension: Secondary | ICD-10-CM | POA: Diagnosis not present

## 2018-06-13 DIAGNOSIS — Z6824 Body mass index (BMI) 24.0-24.9, adult: Secondary | ICD-10-CM | POA: Diagnosis not present

## 2018-06-13 DIAGNOSIS — R350 Frequency of micturition: Secondary | ICD-10-CM | POA: Diagnosis not present

## 2018-06-13 DIAGNOSIS — Z299 Encounter for prophylactic measures, unspecified: Secondary | ICD-10-CM | POA: Diagnosis not present

## 2018-06-13 LAB — BASIC METABOLIC PANEL
Anion gap: 6 (ref 5–15)
BUN: 16 mg/dL (ref 8–23)
CO2: 27 mmol/L (ref 22–32)
Calcium: 9.2 mg/dL (ref 8.9–10.3)
Chloride: 105 mmol/L (ref 98–111)
Creatinine, Ser: 0.62 mg/dL (ref 0.44–1.00)
GFR calc Af Amer: >60 mL/min (ref >60)
GFR calc non Af Amer: >60 mL/min (ref >60)
Glucose, Bld: 120 mg/dL — ABNORMAL HIGH (ref 70–99)
Potassium: 3.5 mmol/L (ref 3.5–5.1)
Sodium: 138 mmol/L (ref 135–145)

## 2018-06-13 LAB — URINALYSIS, ROUTINE W REFLEX MICROSCOPIC
Bilirubin Urine: NEGATIVE
Glucose, UA: NEGATIVE mg/dL
Hgb urine dipstick: NEGATIVE
Ketones, ur: 5 mg/dL — AB
Leukocytes,Ua: NEGATIVE
Nitrite: NEGATIVE
Protein, ur: NEGATIVE mg/dL
Specific Gravity, Urine: 1.006 (ref 1.005–1.030)
pH: 7 (ref 5.0–8.0)

## 2018-06-13 LAB — CBC
HCT: 43.1 % (ref 36.0–46.0)
Hemoglobin: 14 g/dL (ref 12.0–15.0)
MCH: 33 pg (ref 26.0–34.0)
MCHC: 32.5 g/dL (ref 30.0–36.0)
MCV: 101.7 fL — ABNORMAL HIGH (ref 80.0–100.0)
Platelets: 281 10*3/uL (ref 150–400)
RBC: 4.24 MIL/uL (ref 3.87–5.11)
RDW: 12.7 % (ref 11.5–15.5)
WBC: 11.9 10*3/uL — ABNORMAL HIGH (ref 4.0–10.5)
nRBC: 0 % (ref 0.0–0.2)

## 2018-06-13 MED ORDER — ONDANSETRON 4 MG PO TBDP: 4.0000 mg | ORAL_TABLET | Freq: Three times a day (TID) | ORAL | 0 refills | Status: DC | PRN

## 2018-06-13 NOTE — ED Provider Notes (Signed)
[facility] EMERGENCY DEPARTMENT Provider Note   CSN: [id number] Arrival date & time: 06/13/18  1236    History   Chief Complaint Chief Complaint  Patient presents with  . Dysuria    HPI Bethany Villegas is a 84 y.o. female.     HPI  The pt is here with urinary frequency - started in the last couple of days - seems to be getting worse, no associated fevers but did have some dry heaving and one episode of vomiting - no dysuria, has some bilateral flank pain - she has a history of KS's and had gone to her doctor - told that her urine was clean without any signs of infection - recommended that she come to ED.  Sx are persistent - running back and forth to the bathroom - she has no blood that she has seen in the urine - no bowel sx and no meds prior to arrival.  Past Medical History:  Diagnosis Date  . GERD (gastroesophageal reflux disease)   . Graves disease   . Hypothyroid   . Kidney stones     Patient Active Problem List   Diagnosis Date Noted  . GERD (gastroesophageal reflux disease) 07/17/2015  . Graves disease 07/17/2015    Past Surgical History:  Procedure Laterality Date  . ESOPHAGOGASTRODUODENOSCOPY N/A 08/23/2015   Procedure: ESOPHAGOGASTRODUODENOSCOPY (EGD);  Surgeon: Malissa Hippo, MD;  Location: [facility];  Service: Endoscopy;  Laterality: N/A;  235-moved to 220 Ann to notify pt  . TOTAL ABDOMINAL HYSTERECTOMY       OB History   No obstetric history on file.      Home Medications    Prior to Admission medications   Medication Sig Start Date End Date Taking? Authorizing Provider  levothyroxine (SYNTHROID) 75 MCG tablet Take 75 mcg by mouth daily before breakfast.   Yes [provider]  Vitamin D, Ergocalciferol, (DRISDOL) 1.25 MG (50000 UT) CAPS capsule Take 50,000 Units by mouth once a week. 05/08/18  Yes [provider]  calcium carbonate (OS-CAL) 1250 (500 Ca) MG chewable tablet Chew 1 tablet by mouth daily.    [provider]  multivitamin-iron-minerals-folic acid (CENTRUM) chewable tablet Chew 1 tablet by mouth daily.    [provider]  ondansetron (ZOFRAN ODT) 4 MG disintegrating tablet Take 1 tablet (4 mg total) by mouth every 8 (eight) hours as needed for nausea. 06/13/18   Eber Hong, MD  pantoprazole (PROTONIX) 40 MG tablet Take 1 tablet (40 mg total) by mouth 2 (two) times daily before a meal. 08/23/15   Rehman, Joline Maxcy, MD    Family History History reviewed. No pertinent family history.  Social History Social History   Tobacco Use  . Smoking status: Never Smoker  Substance Use Topics  . Alcohol use: No    Alcohol/week: 0.0 standard drinks  . Drug use: No     Allergies   Penicillins and Sulfa antibiotics   Review of Systems Review of Systems  All other systems reviewed and are negative.    Physical Exam Updated Vital Signs BP 136/75 (BP Location: Right Arm)   Pulse 86   Temp 98 F (36.7 C) (Oral)   Resp 16   Ht 1.524 m (5')   Wt 56.7 kg   SpO2 99%   BMI 24.41 kg/m   Physical Exam Vitals signs and nursing note reviewed.  Constitutional:      General: She is not in acute distress.    Appearance: She  is well-developed.  HENT:     Head: Normocephalic and atraumatic.     Mouth/Throat:     Pharynx: No oropharyngeal exudate.  Eyes:     General: No scleral icterus.       Right eye: No discharge.        Left eye: No discharge.     Conjunctiva/sclera: Conjunctivae normal.     Pupils: Pupils are equal, round, and reactive to light.  Neck:     Musculoskeletal: Normal range of motion and neck supple.     Thyroid: No thyromegaly.     Vascular: No JVD.  Cardiovascular:     Rate and Rhythm: Normal rate and regular rhythm.     Heart sounds: Normal heart sounds. No murmur. No friction rub. No gallop.   Pulmonary:     Effort: Pulmonary effort is normal. No respiratory distress.     Breath sounds: Normal breath sounds. No wheezing or rales.  Abdominal:      General: Bowel sounds are normal. There is no distension.     Palpations: Abdomen is soft. There is no mass.     Tenderness: There is no abdominal tenderness.     Comments: Very soft and NT abdomen - no CVA ttp  Musculoskeletal: Normal range of motion.        General: No tenderness.     Left lower leg: Edema ( mild edema c/w R side (chronic)) present.  Lymphadenopathy:     Cervical: No cervical adenopathy.  Skin:    General: Skin is warm and dry.     Findings: No erythema or rash.  Neurological:     Mental Status: She is alert.     Coordination: Coordination normal.     Comments: Normal gait and speech  Psychiatric:        Behavior: Behavior normal.      ED Treatments / Results  Labs (all labs ordered are listed, but only abnormal results are displayed) Labs Reviewed  URINALYSIS, ROUTINE W REFLEX MICROSCOPIC - Abnormal; Notable for the following components:      Result Value   Color, Urine STRAW (*)    Ketones, ur 5 (*)    All other components within normal limits  BASIC METABOLIC PANEL - Abnormal; Notable for the following components:   Glucose, Bld 120 (*)    All other components within normal limits  CBC - Abnormal; Notable for the following components:   WBC 11.9 (*)    MCV 101.7 (*)    All other components within normal limits  URINE CULTURE    EKG None  Radiology US Renal  Result Date: 06/13/2018 CLINICAL DATA:  Frequent urination, history nephrolithiasis, pressure x3 days EXAM: RENAL / URINARY TRACT ULTRASOUND COMPLETE COMPARISON:  None. FINDINGS: Right Kidney: Renal measurements: 9.1 x 4.6 x 4.6 cm = volume: 99 mL . Echogenicity within normal limits. No mass or hydronephrosis visualized. Left Kidney: Renal measurements: 8.2 x 3.9 x 4.6 cm = volume: 76 mL. Echogenicity within normal limits. No mass or hydronephrosis visualized. Bladder: Appears normal for degree of bladder distention. IMPRESSION: Negative.  No hydronephrosis. Electronically Signed   By: Corlis Leak M.D.   On: 06/13/2018 16:01    Procedures Procedures (including critical care time)  Medications Ordered in ED Medications - No data to display   Initial Impression / Assessment and Plan / ED Course  I have reviewed the triage vital signs and the nursing notes.  Pertinent labs & imaging results that were available  during my care of the patient were reviewed by me and considered in my medical decision making (see chart for details).    I have reviewed the EMR - there is no signs of prior imaging of the patients kidney's - CT's or US's.    She has no acute findings of concern on exam to suggest need for imaging - including on ttp to suggest surgical abdomen or appy.    Check UA and culture and US of kidney's - pt agreeable.  US negative Pt not vomiting Stable for d/c at this time No c/o and understanding about need for close f/u for possible cystoscopy - states she has seen Dr. Annabell HowellsWrenn for hematuria in the past without source.  Final Clinical Impressions(s) / ED Diagnoses   Final diagnoses:  Pelvic pain    ED Discharge Orders         Ordered    ondansetron (ZOFRAN ODT) 4 MG disintegrating tablet  Every 8 hours PRN     06/13/18 1609           Eber HongMiller, Ellenora Talton, MD 06/13/18 [phone]

## 2018-06-13 NOTE — Discharge Instructions (Signed)
Your ultrasound and urine test and blood tests were normal appearing and showed no signs of a large kidney stone - you may take zofran as needed for nausea - You may use ibuprofen for pain  You should follow up with your family doctor to recheck your urine sample for blood - If you continue to have blood you will need to have your urologist check you.  ER for worsening symptoms.

## 2018-06-13 NOTE — ED Triage Notes (Signed)
Pt states she began having pressure in her lower abdomen about 3 days ago feeling like she needs to urinate.  Went to pcp this morning and they did a urine and told her no blood, but feels like when she has a kidney stone.

## 2018-06-14 LAB — URINE CULTURE: Culture: NO GROWTH

## 2018-08-12 DIAGNOSIS — Z7189 Other specified counseling: Secondary | ICD-10-CM | POA: Diagnosis not present

## 2018-08-12 DIAGNOSIS — Z1339 Encounter for screening examination for other mental health and behavioral disorders: Secondary | ICD-10-CM | POA: Diagnosis not present

## 2018-08-12 DIAGNOSIS — E7800 Pure hypercholesterolemia, unspecified: Secondary | ICD-10-CM | POA: Diagnosis not present

## 2018-08-12 DIAGNOSIS — R5383 Other fatigue: Secondary | ICD-10-CM | POA: Diagnosis not present

## 2018-08-12 DIAGNOSIS — Z299 Encounter for prophylactic measures, unspecified: Secondary | ICD-10-CM | POA: Diagnosis not present

## 2018-08-12 DIAGNOSIS — Z79899 Other long term (current) drug therapy: Secondary | ICD-10-CM | POA: Diagnosis not present

## 2018-08-12 DIAGNOSIS — Z1211 Encounter for screening for malignant neoplasm of colon: Secondary | ICD-10-CM | POA: Diagnosis not present

## 2018-08-12 DIAGNOSIS — Z0000 Encounter for general adult medical examination without abnormal findings: Secondary | ICD-10-CM | POA: Diagnosis not present

## 2018-08-12 DIAGNOSIS — E559 Vitamin D deficiency, unspecified: Secondary | ICD-10-CM | POA: Diagnosis not present

## 2018-08-12 DIAGNOSIS — I10 Essential (primary) hypertension: Secondary | ICD-10-CM | POA: Diagnosis not present

## 2018-08-12 DIAGNOSIS — E039 Hypothyroidism, unspecified: Secondary | ICD-10-CM | POA: Diagnosis not present

## 2018-08-12 DIAGNOSIS — Z6823 Body mass index (BMI) 23.0-23.9, adult: Secondary | ICD-10-CM | POA: Diagnosis not present

## 2018-08-12 DIAGNOSIS — Z1331 Encounter for screening for depression: Secondary | ICD-10-CM | POA: Diagnosis not present

## 2018-09-27 DIAGNOSIS — H35033 Hypertensive retinopathy, bilateral: Secondary | ICD-10-CM | POA: Diagnosis not present

## 2018-10-04 DIAGNOSIS — I10 Essential (primary) hypertension: Secondary | ICD-10-CM | POA: Diagnosis not present

## 2018-10-04 DIAGNOSIS — R21 Rash and other nonspecific skin eruption: Secondary | ICD-10-CM | POA: Diagnosis not present

## 2018-10-04 DIAGNOSIS — L98499 Non-pressure chronic ulcer of skin of other sites with unspecified severity: Secondary | ICD-10-CM | POA: Diagnosis not present

## 2018-10-04 DIAGNOSIS — Z299 Encounter for prophylactic measures, unspecified: Secondary | ICD-10-CM | POA: Diagnosis not present

## 2018-10-04 DIAGNOSIS — Z6823 Body mass index (BMI) 23.0-23.9, adult: Secondary | ICD-10-CM | POA: Diagnosis not present

## 2018-11-24 DIAGNOSIS — E039 Hypothyroidism, unspecified: Secondary | ICD-10-CM | POA: Diagnosis not present

## 2018-11-24 DIAGNOSIS — Z6824 Body mass index (BMI) 24.0-24.9, adult: Secondary | ICD-10-CM | POA: Diagnosis not present

## 2018-11-24 DIAGNOSIS — N390 Urinary tract infection, site not specified: Secondary | ICD-10-CM | POA: Diagnosis not present

## 2018-11-24 DIAGNOSIS — R319 Hematuria, unspecified: Secondary | ICD-10-CM | POA: Diagnosis not present

## 2018-11-24 DIAGNOSIS — Z299 Encounter for prophylactic measures, unspecified: Secondary | ICD-10-CM | POA: Diagnosis not present

## 2018-11-24 DIAGNOSIS — I10 Essential (primary) hypertension: Secondary | ICD-10-CM | POA: Diagnosis not present

## 2019-01-05 DIAGNOSIS — E039 Hypothyroidism, unspecified: Secondary | ICD-10-CM | POA: Diagnosis not present

## 2019-01-05 DIAGNOSIS — K120 Recurrent oral aphthae: Secondary | ICD-10-CM | POA: Diagnosis not present

## 2019-01-05 DIAGNOSIS — I10 Essential (primary) hypertension: Secondary | ICD-10-CM | POA: Diagnosis not present

## 2019-01-05 DIAGNOSIS — Z299 Encounter for prophylactic measures, unspecified: Secondary | ICD-10-CM | POA: Diagnosis not present

## 2019-01-05 DIAGNOSIS — J329 Chronic sinusitis, unspecified: Secondary | ICD-10-CM | POA: Diagnosis not present

## 2019-01-18 DIAGNOSIS — R928 Other abnormal and inconclusive findings on diagnostic imaging of breast: Secondary | ICD-10-CM | POA: Diagnosis not present

## 2019-01-18 DIAGNOSIS — N6452 Nipple discharge: Secondary | ICD-10-CM | POA: Diagnosis not present

## 2019-03-23 DIAGNOSIS — Z299 Encounter for prophylactic measures, unspecified: Secondary | ICD-10-CM | POA: Diagnosis not present

## 2019-03-23 DIAGNOSIS — Z789 Other specified health status: Secondary | ICD-10-CM | POA: Diagnosis not present

## 2019-03-23 DIAGNOSIS — L98499 Non-pressure chronic ulcer of skin of other sites with unspecified severity: Secondary | ICD-10-CM | POA: Diagnosis not present

## 2019-03-23 DIAGNOSIS — I80292 Phlebitis and thrombophlebitis of other deep vessels of left lower extremity: Secondary | ICD-10-CM | POA: Diagnosis not present

## 2019-03-23 DIAGNOSIS — I10 Essential (primary) hypertension: Secondary | ICD-10-CM | POA: Diagnosis not present

## 2019-03-23 DIAGNOSIS — Z6825 Body mass index (BMI) 25.0-25.9, adult: Secondary | ICD-10-CM | POA: Diagnosis not present

## 2019-03-29 DIAGNOSIS — L98499 Non-pressure chronic ulcer of skin of other sites with unspecified severity: Secondary | ICD-10-CM | POA: Diagnosis not present

## 2019-03-29 DIAGNOSIS — Z6824 Body mass index (BMI) 24.0-24.9, adult: Secondary | ICD-10-CM | POA: Diagnosis not present

## 2019-03-29 DIAGNOSIS — Z299 Encounter for prophylactic measures, unspecified: Secondary | ICD-10-CM | POA: Diagnosis not present

## 2019-03-29 DIAGNOSIS — I10 Essential (primary) hypertension: Secondary | ICD-10-CM | POA: Diagnosis not present

## 2019-03-29 DIAGNOSIS — I80292 Phlebitis and thrombophlebitis of other deep vessels of left lower extremity: Secondary | ICD-10-CM | POA: Diagnosis not present

## 2019-04-11 DIAGNOSIS — E039 Hypothyroidism, unspecified: Secondary | ICD-10-CM | POA: Diagnosis not present

## 2019-04-11 DIAGNOSIS — Z6823 Body mass index (BMI) 23.0-23.9, adult: Secondary | ICD-10-CM | POA: Diagnosis not present

## 2019-04-11 DIAGNOSIS — Z299 Encounter for prophylactic measures, unspecified: Secondary | ICD-10-CM | POA: Diagnosis not present

## 2019-04-11 DIAGNOSIS — I10 Essential (primary) hypertension: Secondary | ICD-10-CM | POA: Diagnosis not present

## 2019-04-11 DIAGNOSIS — L98499 Non-pressure chronic ulcer of skin of other sites with unspecified severity: Secondary | ICD-10-CM | POA: Diagnosis not present

## 2019-04-11 DIAGNOSIS — I80292 Phlebitis and thrombophlebitis of other deep vessels of left lower extremity: Secondary | ICD-10-CM | POA: Diagnosis not present

## 2019-04-19 DIAGNOSIS — L98499 Non-pressure chronic ulcer of skin of other sites with unspecified severity: Secondary | ICD-10-CM | POA: Diagnosis not present

## 2019-04-19 DIAGNOSIS — I10 Essential (primary) hypertension: Secondary | ICD-10-CM | POA: Diagnosis not present

## 2019-04-19 DIAGNOSIS — Z299 Encounter for prophylactic measures, unspecified: Secondary | ICD-10-CM | POA: Diagnosis not present

## 2019-04-19 DIAGNOSIS — Z6824 Body mass index (BMI) 24.0-24.9, adult: Secondary | ICD-10-CM | POA: Diagnosis not present

## 2019-04-19 DIAGNOSIS — I809 Phlebitis and thrombophlebitis of unspecified site: Secondary | ICD-10-CM | POA: Diagnosis not present

## 2019-08-29 DIAGNOSIS — Z6823 Body mass index (BMI) 23.0-23.9, adult: Secondary | ICD-10-CM | POA: Diagnosis not present

## 2019-08-29 DIAGNOSIS — Z1331 Encounter for screening for depression: Secondary | ICD-10-CM | POA: Diagnosis not present

## 2019-08-29 DIAGNOSIS — Z1339 Encounter for screening examination for other mental health and behavioral disorders: Secondary | ICD-10-CM | POA: Diagnosis not present

## 2019-08-29 DIAGNOSIS — E7800 Pure hypercholesterolemia, unspecified: Secondary | ICD-10-CM | POA: Diagnosis not present

## 2019-08-29 DIAGNOSIS — I10 Essential (primary) hypertension: Secondary | ICD-10-CM | POA: Diagnosis not present

## 2019-08-29 DIAGNOSIS — Z299 Encounter for prophylactic measures, unspecified: Secondary | ICD-10-CM | POA: Diagnosis not present

## 2019-08-29 DIAGNOSIS — Z0000 Encounter for general adult medical examination without abnormal findings: Secondary | ICD-10-CM | POA: Diagnosis not present

## 2019-08-29 DIAGNOSIS — Z7189 Other specified counseling: Secondary | ICD-10-CM | POA: Diagnosis not present

## 2019-08-29 DIAGNOSIS — R5383 Other fatigue: Secondary | ICD-10-CM | POA: Diagnosis not present

## 2019-08-29 DIAGNOSIS — E039 Hypothyroidism, unspecified: Secondary | ICD-10-CM | POA: Diagnosis not present

## 2019-09-08 DIAGNOSIS — E2839 Other primary ovarian failure: Secondary | ICD-10-CM | POA: Diagnosis not present

## 2019-09-08 DIAGNOSIS — Z79899 Other long term (current) drug therapy: Secondary | ICD-10-CM | POA: Diagnosis not present

## 2019-09-08 DIAGNOSIS — M859 Disorder of bone density and structure, unspecified: Secondary | ICD-10-CM | POA: Diagnosis not present

## 2019-09-20 DIAGNOSIS — Z299 Encounter for prophylactic measures, unspecified: Secondary | ICD-10-CM | POA: Diagnosis not present

## 2019-09-20 DIAGNOSIS — M810 Age-related osteoporosis without current pathological fracture: Secondary | ICD-10-CM | POA: Diagnosis not present

## 2019-09-20 DIAGNOSIS — I80292 Phlebitis and thrombophlebitis of other deep vessels of left lower extremity: Secondary | ICD-10-CM | POA: Diagnosis not present

## 2019-09-20 DIAGNOSIS — I10 Essential (primary) hypertension: Secondary | ICD-10-CM | POA: Diagnosis not present

## 2019-09-20 DIAGNOSIS — E039 Hypothyroidism, unspecified: Secondary | ICD-10-CM | POA: Diagnosis not present

## 2019-09-27 ENCOUNTER — Other Ambulatory Visit: Payer: Self-pay

## 2019-09-27 DIAGNOSIS — I8390 Asymptomatic varicose veins of unspecified lower extremity: Secondary | ICD-10-CM

## 2019-09-30 DIAGNOSIS — I83892 Varicose veins of left lower extremities with other complications: Secondary | ICD-10-CM | POA: Diagnosis not present

## 2019-09-30 DIAGNOSIS — I83891 Varicose veins of right lower extremities with other complications: Secondary | ICD-10-CM | POA: Diagnosis not present

## 2019-09-30 DIAGNOSIS — I83893 Varicose veins of bilateral lower extremities with other complications: Secondary | ICD-10-CM | POA: Diagnosis not present

## 2019-10-04 DIAGNOSIS — E039 Hypothyroidism, unspecified: Secondary | ICD-10-CM | POA: Diagnosis not present

## 2019-10-04 DIAGNOSIS — I10 Essential (primary) hypertension: Secondary | ICD-10-CM | POA: Diagnosis not present

## 2019-10-04 DIAGNOSIS — I8393 Asymptomatic varicose veins of bilateral lower extremities: Secondary | ICD-10-CM | POA: Diagnosis not present

## 2019-10-04 DIAGNOSIS — Z299 Encounter for prophylactic measures, unspecified: Secondary | ICD-10-CM | POA: Diagnosis not present

## 2019-10-04 DIAGNOSIS — L98499 Non-pressure chronic ulcer of skin of other sites with unspecified severity: Secondary | ICD-10-CM | POA: Diagnosis not present

## 2019-10-11 DIAGNOSIS — I10 Essential (primary) hypertension: Secondary | ICD-10-CM | POA: Diagnosis not present

## 2019-10-11 DIAGNOSIS — Z299 Encounter for prophylactic measures, unspecified: Secondary | ICD-10-CM | POA: Diagnosis not present

## 2019-10-11 DIAGNOSIS — L98499 Non-pressure chronic ulcer of skin of other sites with unspecified severity: Secondary | ICD-10-CM | POA: Diagnosis not present

## 2019-10-11 DIAGNOSIS — E039 Hypothyroidism, unspecified: Secondary | ICD-10-CM | POA: Diagnosis not present

## 2019-10-13 ENCOUNTER — Ambulatory Visit (HOSPITAL_COMMUNITY)
Admission: RE | Admit: 2019-10-13 | Discharge: 2019-10-13 | Disposition: A | Discharge status: Home / Self Care | Payer: Medicare Other | Source: Ambulatory Visit | Attending: Vascular Surgery | Admitting: Vascular Surgery

## 2019-10-13 ENCOUNTER — Other Ambulatory Visit: Payer: Self-pay

## 2019-10-13 ENCOUNTER — Ambulatory Visit (INDEPENDENT_AMBULATORY_CARE_PROVIDER_SITE_OTHER): Payer: Medicare Other | Admitting: Physician Assistant

## 2019-10-13 VITALS — BP 123/70 | HR 76 | Temp 98.0°F | Resp 14 | Ht 59.0 in | Wt 110.0 lb

## 2019-10-13 DIAGNOSIS — I8390 Asymptomatic varicose veins of unspecified lower extremity: Secondary | ICD-10-CM

## 2019-10-13 DIAGNOSIS — I8393 Asymptomatic varicose veins of bilateral lower extremities: Secondary | ICD-10-CM

## 2019-10-13 DIAGNOSIS — I83892 Varicose veins of left lower extremities with other complications: Secondary | ICD-10-CM

## 2019-10-13 MED ORDER — DOXYCYCLINE HYCLATE 100 MG PO CAPS: 100.0000 mg | ORAL_CAPSULE | Freq: Two times a day (BID) | ORAL | 0 refills | Status: DC

## 2019-10-13 NOTE — Progress Notes (Signed)
[facility]           OF [place]  History and Physical   Bethany Villegas is a 85 y.o. female who presents with hx of varicose veins.  She states that she most recently went to the ER for a bleeding varicosity on 8/14.  She states that was her 2nd visit to the ER in the past couple of months.  She had to call EMS on another occasion for bleeding from her veins on her left leg.  She states that she has some soreness in between the two areas that bled.  She states it is tender to touch and not getting any better.  She states it started after they wrapped her leg with ace wrap.  When asked about DVT, she states they thought she may have had a DVT last year bc of swelling, however, due to covid, she did not go get an u/s.  She was not started on any AC.  She states that her sister may have had some issues with varicose veins.  She does have skin changes on the LLE.  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  She does wear knee high compression while working.   She does have leg swelling.  She states this is better in the mornings after she has been in bed for the night.  She states she has lost some weight and she feels this is due to worrying about her leg that has been bleeding.    There is no family hx of AAA.    The pt is not on a statin for cholesterol management.  The pt is not on a daily aspirin.   Other AC:  none The pt is not on medication for hypertension.   The pt is not diabetic.   Tobacco hx:  never   Past Medical History:  Diagnosis Date  . GERD (gastroesophageal reflux disease)   . Graves disease   . Hypothyroid   . Kidney stones     Past Surgical History:  Procedure Laterality Date  . ESOPHAGOGASTRODUODENOSCOPY N/A 08/23/2015   Procedure: ESOPHAGOGASTRODUODENOSCOPY (EGD);  Surgeon: Malissa Hippo, MD;  Location: [facility];  Service: Endoscopy;  Laterality: N/A;  235-moved to 220 Ann to notify pt  .  TOTAL ABDOMINAL HYSTERECTOMY      Social History   Socioeconomic History  . Marital status: Single    Spouse name: Not on file  . Number of children: Not on file  . Years of education: Not on file  . Highest education level: Not on file  Occupational History  . Not on file  Tobacco Use  . Smoking status: Never Smoker  Substance and Sexual Activity  . Alcohol use: No    Alcohol/week: 0.0 standard drinks  . Drug use: No  . Sexual activity: Not on file  Other Topics Concern  . Not on file  Social History Narrative  . Not on file   Social Determinants of Health   Financial Resource Strain:   . Difficulty of Paying Living Expenses: Not on file  Food Insecurity:   . Worried About Programme researcher, broadcasting/film/video in the Last Year: Not on file  . Ran Out of Food in the Last Year: Not on file  Transportation Needs:   . Lack of Transportation (Medical): Not on file  . Lack of Transportation (Non-Medical): Not on file  Physical  Activity:   . Days of Exercise per Week: Not on file  . Minutes of Exercise per Session: Not on file  Stress:   . Feeling of Stress : Not on file  Social Connections:   . Frequency of Communication with Friends and Family: Not on file  . Frequency of Social Gatherings with Friends and Family: Not on file  . Attends Religious Services: Not on file  . Active Member of Clubs or Organizations: Not on file  . Attends BankerClub or Organization Meetings: Not on file  . Marital Status: Not on file  Intimate Partner Violence:   . Fear of Current or Ex-Partner: Not on file  . Emotionally Abused: Not on file  . Physically Abused: Not on file  . Sexually Abused: Not on file    Family History: -she had a sister with varicose veins. -mother had cardiac disease -negative family hx for AAA  Current Outpatient Medications  Medication Sig Dispense Refill  . calcium carbonate (OS-CAL) 1250 (500 Ca) MG chewable tablet Chew 1 tablet by mouth daily.    Marland Kitchen. levothyroxine (SYNTHROID)  75 MCG tablet Take 75 mcg by mouth daily before breakfast.    . multivitamin-iron-minerals-folic acid (CENTRUM) chewable tablet Chew 1 tablet by mouth daily.    . ondansetron (ZOFRAN ODT) 4 MG disintegrating tablet Take 1 tablet (4 mg total) by mouth every 8 (eight) hours as needed for nausea. 10 tablet 0  . pantoprazole (PROTONIX) 40 MG tablet Take 1 tablet (40 mg total) by mouth 2 (two) times daily before a meal. 60 tablet 2  . Vitamin D, Ergocalciferol, (DRISDOL) 1.25 MG (50000 UT) CAPS capsule Take 50,000 Units by mouth once a week.     No current facility-administered medications for this visit.    Allergies  Allergen Reactions  . Penicillins     Rash, arm swelled  . Sulfa Antibiotics     Rash     REVIEW OF SYSTEMS:   [X]  denotes positive finding, [ ]  denotes negative finding Cardiac  Comments:  Chest pain or chest pressure:    Shortness of breath upon exertion:    Short of breath when lying flat:    Irregular heart rhythm:        Vascular    Pain in calf, thigh, or hip brought on by ambulation:    Pain in feet at night that wakes you up from your sleep:     Blood clot in your veins:    Leg swelling:  x       Pulmonary    Oxygen at home:    Productive cough:     Wheezing:         Neurologic    Sudden weakness in arms or legs:     Sudden numbness in arms or legs:     Sudden onset of difficulty speaking or slurred speech:    Temporary loss of vision in one eye:     Problems with dizziness:         Gastrointestinal    Blood in stool:     Vomited blood:         Genitourinary    Burning when urinating:     Blood in urine:        Psychiatric    Major depression:         Hematologic    Bleeding problems:    Problems with blood clotting too easily:        Skin    Rashes  or ulcers:        Constitutional    Fever or chills:      PHYSICAL EXAMINATION:  Today's Vitals   10/13/19 1246  BP: (!) 150/85  Pulse: 76  Resp: 14  Temp: 98 F (36.7 C)    TempSrc: Temporal  SpO2: 98%  Weight: 110 lb (49.9 kg)  Height: 4\' 11"  (1.499 m)   Body mass index is 22.22 kg/m.   General:  WDWN in NAD; vital signs documented above Gait: Not observed HENT: WNL, normocephalic Pulmonary: normal non-labored breathing without wheezing Cardiac: regular HR; without carotid bruits Abdomen: soft, NT, no masses Skin: without rashes Vascular Exam/Pulses:  Right Left  Radial 2+ (normal) 2+ (normal)  DP Unable to palpate  2+ (normal)  PT 2+ (normal) 2+ (normal)   Extremities:          Musculoskeletal: no muscle wasting or atrophy  Neurologic: A&O X 3;  moving all extremities equally Psychiatric:  The pt has Normal affect.   Non-Invasive Vascular Imaging:   Venous duplex on 10/13/2019: LEFT     Reflux NoRefluxReflux TimeDiameter cmsComments               Yes                   +--------------+---------+------+-----------+------------+--------+  CFV            yes  >1 second             +--------------+---------+------+-----------+------------+--------+  GSV at SFJ        yes  >500 ms   0.87        +--------------+---------+------+-----------+------------+--------+  GSV prox thigh      yes  >500 ms   0.70        +--------------+---------+------+-----------+------------+--------+  GSV mid thigh       yes  >500 ms   0.53        +--------------+---------+------+-----------+------------+--------+  GSV dist thigh      yes  >500 ms   0.59        +--------------+---------+------+-----------+------------+--------+  GSV at knee        yes  >500 ms   1.01        +--------------+---------+------+-----------+------------+--------+  GSV prox calf       yes  >500 ms   0.55        +--------------+---------+------+-----------+------------+--------+   GSV mid calf                 0.36        +--------------+---------+------+-----------+------------+--------+  SSV prox calf                 0.17        +--------------+---------+------+-----------+------------+--------+  SSV mid calf                 0.29        +--------------+---------+------+-----------+------------+--------+  AASV                     0.47        +--------------+---------+------+-----------+------------+--------+   Summary:  Left:  - No evidence of deep vein thrombosis seen in the left lower extremity,  from the common femoral through the popliteal veins.  - No evidence of superficial venous reflux seen in the left short  saphenous vein.  - Venous reflux is noted in the left common femoral vein.  - Venous reflux is noted in the left greater saphenous vein in the thigh.  - Venous reflux is  noted in the left greater saphenous vein in the calf.    Bethany Villegas is a 85 y.o. female who presents with: LLE varicosities with areas of bleeding on 3 occasions over the past 2-3 months that required ER or EMS visits.    -pt's duplex today does reveal venous reflux in the left GSV from the GSV at the SFJ to the GSV in the proximal calf -discussed with pt about wearing 20-9mmHg thigh high compression stockings and elevating legs.    -she does have an area of questionable cellulitis in between 2 areas that have bled since mid August that continue to be painful and sore.  I did prescribe her 4 days of Doxycycline to see if we can get her some relief.   -given that she has had several visits to the ER for bleeding varicose veins, I discussed with her to elevate her leg and hold pressure for about 20-30 minutes if this should bleed again.  Given her multiple episodes, hopeful we can get laser ablation approved prior to 3 months.  Will also discuss with vein RN about  sclerotherapy into bleeding areas.  -She was measured for 20-58mmHg thigh high compression today and did receive a pair prior to leaving the office.     Doreatha Massed, [facility] 10/13/2019 12:36 PM  Clinic MD:  Randie Heinz on call MD

## 2019-10-17 DIAGNOSIS — L03119 Cellulitis of unspecified part of limb: Secondary | ICD-10-CM | POA: Diagnosis not present

## 2019-10-17 DIAGNOSIS — Z299 Encounter for prophylactic measures, unspecified: Secondary | ICD-10-CM | POA: Diagnosis not present

## 2019-10-17 DIAGNOSIS — L98499 Non-pressure chronic ulcer of skin of other sites with unspecified severity: Secondary | ICD-10-CM | POA: Diagnosis not present

## 2019-10-17 DIAGNOSIS — I10 Essential (primary) hypertension: Secondary | ICD-10-CM | POA: Diagnosis not present

## 2019-10-17 DIAGNOSIS — I80292 Phlebitis and thrombophlebitis of other deep vessels of left lower extremity: Secondary | ICD-10-CM | POA: Diagnosis not present

## 2019-10-18 ENCOUNTER — Other Ambulatory Visit: Payer: Self-pay

## 2019-10-18 ENCOUNTER — Encounter: Payer: Self-pay | Admitting: Vascular Surgery

## 2019-10-18 ENCOUNTER — Ambulatory Visit (INDEPENDENT_AMBULATORY_CARE_PROVIDER_SITE_OTHER): Payer: Medicare Other | Admitting: Vascular Surgery

## 2019-10-18 ENCOUNTER — Other Ambulatory Visit: Payer: Self-pay | Admitting: *Deleted

## 2019-10-18 VITALS — BP 150/70 | HR 83 | Temp 98.1°F | Resp 18 | Ht 59.0 in | Wt 113.0 lb

## 2019-10-18 DIAGNOSIS — I83812 Varicose veins of left lower extremities with pain: Secondary | ICD-10-CM

## 2019-10-18 NOTE — Progress Notes (Signed)
Patient is an 85 year old female who has had multiple recent bleeding episodes from varicosities in her left ankle area.  She has had at least 2-3 bleeding episodes in the last 6 months.  Her last bleeding episode was 2 weeks ago.  She has had at least one emergency room visit and several visits by EMS to help control the bleeding.  He has been wearing compression stockings for about a year.  She has a job where she is standing upright all day long at [employer].  She does not have any known history of DVT but did have some leg swelling about a year ago.  Review of systems: She has no shortness of breath.  She has no chest pain.  She does not take any anticoagulants.  Physical exam:  Vitals:   10/18/19 0957  BP: (!) 150/70  Pulse: 83  Resp: 18  Temp: 98.1 F (36.7 C)  TempSrc: Temporal  SpO2: (!) 76%  Weight: 113 lb (51.3 kg)  Height: 4\' 11"  (1.499 m)    Extremities: 2+ dorsalis pedis pulses bilaterally multiple clusters of varicosities around the foot and ankle bilaterally with 2 areas of dry eschar on the left lateral malleolus with recent bleed episode  She has surface varicosities diffusely in both legs but more prominent large tributary type varicosities on the anterior and medial left tibia.  Data: I did a SonoSite ultrasound of her left leg today which shows a 6 mm left greater saphenous vein fairly uniform in diameter all the way to the saphenofemoral junction.  I reviewed her previous venous duplex exam from last week which shows diffuse reflux in the left greater saphenous vein as well as the common femoral vein.  Vein diameter was 5 to 7 mm.  Lesser saphenous vein was fairly small.  She did have an accessory saphenous which was 4.7 mm.  Assessment: Patient with symptomatic varicose veins with bleeding in the left leg.  Plan: Laser ablation of left greater saphenous vein with 10-20 stab avulsions with the addition of sclerotherapy for the bleeding areas in the left leg.  We will  schedule this for her in the next few weeks.  Additionally I did discuss with her that at some point we probably need to do a reflux exam of the right leg as she is certainly at risk for having similar episodes on this side.  Risk benefits possible complications and procedure details of laser ablation stab avulsions were discussed with patient today include but not limited to bleeding infection nerve injury areas of periincisional numbness.  She understands and agrees to proceed.  , MD [facility] of [place] Office: [phone]

## 2019-10-24 DIAGNOSIS — Z299 Encounter for prophylactic measures, unspecified: Secondary | ICD-10-CM | POA: Diagnosis not present

## 2019-10-24 DIAGNOSIS — I10 Essential (primary) hypertension: Secondary | ICD-10-CM | POA: Diagnosis not present

## 2019-10-24 DIAGNOSIS — E039 Hypothyroidism, unspecified: Secondary | ICD-10-CM | POA: Diagnosis not present

## 2019-10-24 DIAGNOSIS — L03119 Cellulitis of unspecified part of limb: Secondary | ICD-10-CM | POA: Diagnosis not present

## 2019-10-30 ENCOUNTER — Ambulatory Visit (INDEPENDENT_AMBULATORY_CARE_PROVIDER_SITE_OTHER): Payer: Medicare Other

## 2019-10-30 ENCOUNTER — Other Ambulatory Visit: Payer: Self-pay

## 2019-10-30 DIAGNOSIS — I8393 Asymptomatic varicose veins of bilateral lower extremities: Secondary | ICD-10-CM | POA: Diagnosis not present

## 2019-10-30 NOTE — Progress Notes (Signed)
Treated pt's L outer ankle small veins that have caused bleeds in the past. A total of 2 mL of Asclera 1% was used. There are 2 non healing ulcers on L outer ankle that pt was recently treated for with antibiotics for cellulitis. She has completed antibiotics and feels these areas look better than they had but they are still extremely sore and tender. Dr. Myra Gianotti assessed the area prior to treatment and we discussed veins to be treated. Pt was in pain during treatment and was crying out. She is due to return next week for laser ablation. Compression stocking was applied.

## 2019-10-31 ENCOUNTER — Other Ambulatory Visit: Payer: Self-pay

## 2019-10-31 DIAGNOSIS — I83812 Varicose veins of left lower extremities with pain: Secondary | ICD-10-CM

## 2019-11-01 ENCOUNTER — Other Ambulatory Visit: Payer: Self-pay | Admitting: *Deleted

## 2019-11-01 ENCOUNTER — Telehealth: Payer: Self-pay

## 2019-11-01 DIAGNOSIS — F411 Generalized anxiety disorder: Secondary | ICD-10-CM

## 2019-11-01 MED ORDER — LORAZEPAM 1 MG PO TABS: ORAL_TABLET | ORAL | 0 refills | Status: DC

## 2019-11-01 NOTE — Telephone Encounter (Signed)
Spoke to Bethany Villegas to let her know we need a disability form from [place], as she has asked [place] to provide them this. She said she spoke with them and they are to fax it to [place] this week. Bethany Villegas also mentioned the 2 areas on her ankle seem "raw". I explained to her that the scabs were falling off the day we did the sclero treatment and they came off as soon as the area was cleaned. Encouraged her to keep the area clean and dry and wear her compression stockings all day. Let her know to inform us if area does not heal or if there is any bleeding, drainage, or if anything else unusual develops. Bethany Villegas verbalized understanding.

## 2019-11-02 DIAGNOSIS — L03119 Cellulitis of unspecified part of limb: Secondary | ICD-10-CM | POA: Diagnosis not present

## 2019-11-02 DIAGNOSIS — I8393 Asymptomatic varicose veins of bilateral lower extremities: Secondary | ICD-10-CM | POA: Diagnosis not present

## 2019-11-02 DIAGNOSIS — I10 Essential (primary) hypertension: Secondary | ICD-10-CM | POA: Diagnosis not present

## 2019-11-02 DIAGNOSIS — Z299 Encounter for prophylactic measures, unspecified: Secondary | ICD-10-CM | POA: Diagnosis not present

## 2019-11-02 DIAGNOSIS — E039 Hypothyroidism, unspecified: Secondary | ICD-10-CM | POA: Diagnosis not present

## 2019-11-07 ENCOUNTER — Encounter: Payer: Self-pay | Admitting: Vascular Surgery

## 2019-11-08 ENCOUNTER — Other Ambulatory Visit: Payer: Self-pay

## 2019-11-08 ENCOUNTER — Ambulatory Visit (INDEPENDENT_AMBULATORY_CARE_PROVIDER_SITE_OTHER): Payer: Medicare Other | Admitting: Vascular Surgery

## 2019-11-08 ENCOUNTER — Encounter: Payer: Self-pay | Admitting: Vascular Surgery

## 2019-11-08 DIAGNOSIS — I83812 Varicose veins of left lower extremities with pain: Secondary | ICD-10-CM | POA: Diagnosis not present

## 2019-11-08 HISTORY — PX: ENDOVENOUS ABLATION SAPHENOUS VEIN W/ LASER: SUR449

## 2019-11-08 NOTE — Progress Notes (Signed)
     Laser Ablation Procedure    Date: 11/08/2019   Bethany Villegas DOB:06/21/1934  Consent signed: Yes      Surgeon: Fabienne Bruns MD   Procedure: Laser Ablation: left Greater Saphenous Vein  BP 118/73 (BP Location: Left Arm, Patient Position: Sitting, Cuff Size: Normal)   Pulse 85   Temp (!) 97.3 F (36.3 C) (Temporal)   Resp 16   Ht 4\' 11"  (1.499 m)   Wt 112 lb (50.8 kg)   SpO2 98%   BMI 22.62 kg/m   Tumescent Anesthesia: 425 cc 0.9% NaCl with 50 cc Lidocaine HCL 1%  and 15 cc 8.4% NaHCO3  Local Anesthesia: 12 cc Lidocaine HCL and NaHCO3 (ratio 2:1)  7 watts continuous mode     Total energy: 1277 Joules    Total time: 182 seconds Treatment Length 23 cm  Laser Fiber Ref. #         Lot # [id number]   Stab Phlebectomy: 10-20 Sites: Thigh and Calf  Patient tolerated procedure well  Notes: Patient wore face mask.  All staff members wore facial masks and facial shields/goggles.  Ms. Fletchall took Ativan 1 mg at 8:45 AM on 11-08-2019.  Description of Procedure:  After marking the course of the secondary varicosities, the patient was placed on the operating table in the supine position, and the left leg was prepped and draped in sterile fashion.   Local anesthetic was administered and under ultrasound guidance the saphenous vein was accessed with a micro needle and guide wire; then the mirco puncture sheath was placed.  A guide wire was inserted saphenofemoral junction , followed by a 5 french sheath.  The position of the sheath and then the laser fiber below the junction was confirmed using the ultrasound.  Tumescent anesthesia was administered along the course of the saphenous vein using ultrasound guidance. The patient was placed in Trendelenburg position and protective laser glasses were placed on patient and staff, and the laser was fired at 7 watts continuous mode for a total of 1277 joules.   For stab phlebectomies, local anesthetic was administered at the  previously marked varicosities, and tumescent anesthesia was administered around the vessels.  Ten to 20 stab wounds were made using the tip of an 11 blade. And using the vein hook, the phlebectomies were performed using a hemostat to avulse the varicosities.  Adequate hemostasis was achieved.     Steri strips were applied to the stab wounds and ABD pads and thigh high compression stockings were applied.  Ace wrap bandages were applied over the phlebectomy sites and at the top of the saphenofemoral junction. Blood loss was less than 15 cc.  Discharge instructions reviewed with patient and hardcopy of discharge instructions given to patient to take home. The patient ambulated out of the operating room having tolerated the procedure well.  11-10-2019, MD [facility] of [place] Office: [phone]

## 2019-11-15 ENCOUNTER — Ambulatory Visit (HOSPITAL_COMMUNITY)
Admission: RE | Admit: 2019-11-15 | Discharge: 2019-11-15 | Disposition: A | Discharge status: Home / Self Care | Payer: Medicare Other | Source: Ambulatory Visit | Attending: Vascular Surgery | Admitting: Vascular Surgery

## 2019-11-15 ENCOUNTER — Other Ambulatory Visit: Payer: Self-pay

## 2019-11-15 ENCOUNTER — Encounter: Payer: Self-pay | Admitting: Vascular Surgery

## 2019-11-15 ENCOUNTER — Ambulatory Visit (INDEPENDENT_AMBULATORY_CARE_PROVIDER_SITE_OTHER): Payer: Self-pay | Admitting: Vascular Surgery

## 2019-11-15 VITALS — BP 157/84 | HR 87 | Temp 97.9°F | Resp 16 | Ht 59.0 in | Wt 112.0 lb

## 2019-11-15 DIAGNOSIS — I83812 Varicose veins of left lower extremities with pain: Secondary | ICD-10-CM

## 2019-11-15 NOTE — Progress Notes (Signed)
Patient is an 85 year old female who returns today for postoperative follow-up after laser ablation and stab avulsions in the left lower extremity November 08, 2019.  She still complains of some pain around pre-existing ulcers and areas that were injected with sclerotherapy on her left ankle.  Otherwise she is doing well.  She has no shortness of breath.  She has no chest pain.  She is asking for a few weeks off from work still until the soreness resolves in her left leg.  Physical exam:  Vitals:   11/15/19 1023  BP: (!) 157/84  Pulse: 87  Resp: 16  Temp: 97.9 F (36.6 C)  TempSrc: Temporal  SpO2: 98%  Weight: 112 lb (50.8 kg)  Height: 4\' 11"  (1.499 m)   Mild ecchymosis left medial thigh several areas of Steri-Strips that are starting to curl up.  She has some dry eschar in the left lateral malleolus at area of previous bleeding and injection.  She also has 2 bulging varicosities on the dorsal aspect of her left foot which almost are blisterlike.  Data: Patient had duplex ultrasound today which shows successful closure of her left greater saphenous vein within 1 cm of the saphenofemoral junction.  Assessment: Healing left lower extremity after laser ablation for varicose veins left leg to prevent further bleeding episodes.  Plan: Patient will continue to elevate her leg and wear her compression stockings for symptomatic relief and also to continue to improve wound healing in her left leg.  We will keep her out of work for a few more weeks.  She was given a note for work today.  She will follow-up with on an as-needed basis if she has nonhealing of the wounds or further ulceration or bleeding.  [place], MD [facility] [place] Office: [phone]

## 2019-11-28 DIAGNOSIS — I10 Essential (primary) hypertension: Secondary | ICD-10-CM | POA: Diagnosis not present

## 2019-11-28 DIAGNOSIS — E039 Hypothyroidism, unspecified: Secondary | ICD-10-CM | POA: Diagnosis not present

## 2019-11-28 DIAGNOSIS — L03116 Cellulitis of left lower limb: Secondary | ICD-10-CM | POA: Diagnosis not present

## 2019-11-28 DIAGNOSIS — L98499 Non-pressure chronic ulcer of skin of other sites with unspecified severity: Secondary | ICD-10-CM | POA: Diagnosis not present

## 2019-11-28 DIAGNOSIS — Z299 Encounter for prophylactic measures, unspecified: Secondary | ICD-10-CM | POA: Diagnosis not present

## 2020-01-10 DIAGNOSIS — Z299 Encounter for prophylactic measures, unspecified: Secondary | ICD-10-CM | POA: Diagnosis not present

## 2020-01-10 DIAGNOSIS — L98499 Non-pressure chronic ulcer of skin of other sites with unspecified severity: Secondary | ICD-10-CM | POA: Diagnosis not present

## 2020-01-10 DIAGNOSIS — I10 Essential (primary) hypertension: Secondary | ICD-10-CM | POA: Diagnosis not present

## 2020-01-10 DIAGNOSIS — Z2821 Immunization not carried out because of patient refusal: Secondary | ICD-10-CM | POA: Diagnosis not present

## 2020-01-10 DIAGNOSIS — R609 Edema, unspecified: Secondary | ICD-10-CM | POA: Diagnosis not present

## 2020-01-17 ENCOUNTER — Ambulatory Visit: Payer: Medicare Other | Admitting: Vascular Surgery

## 2020-01-24 DIAGNOSIS — S92015A Nondisplaced fracture of body of left calcaneus, initial encounter for closed fracture: Secondary | ICD-10-CM | POA: Diagnosis not present

## 2020-01-24 DIAGNOSIS — M79672 Pain in left foot: Secondary | ICD-10-CM | POA: Diagnosis not present

## 2020-02-07 ENCOUNTER — Telehealth: Payer: Self-pay

## 2020-02-07 NOTE — Telephone Encounter (Signed)
Patient called to report a knot above her L knee that has been present since laser ablation on 09/22. Smaller than a dime, not painful, or discolored. Explained that this was not anything to worry about, but patient would really like an appointment and an ultrasound to have it evaluated. She will call us back when she knows what Wednesday she can come to the office.

## 2020-02-28 DIAGNOSIS — S92015D Nondisplaced fracture of body of left calcaneus, subsequent encounter for fracture with routine healing: Secondary | ICD-10-CM | POA: Diagnosis not present

## 2020-02-28 DIAGNOSIS — M79672 Pain in left foot: Secondary | ICD-10-CM | POA: Diagnosis not present

## 2020-03-26 DIAGNOSIS — I10 Essential (primary) hypertension: Secondary | ICD-10-CM | POA: Diagnosis not present

## 2020-03-26 DIAGNOSIS — E039 Hypothyroidism, unspecified: Secondary | ICD-10-CM | POA: Diagnosis not present

## 2020-03-26 DIAGNOSIS — L98499 Non-pressure chronic ulcer of skin of other sites with unspecified severity: Secondary | ICD-10-CM | POA: Diagnosis not present

## 2020-03-26 DIAGNOSIS — E7800 Pure hypercholesterolemia, unspecified: Secondary | ICD-10-CM | POA: Diagnosis not present

## 2020-03-26 DIAGNOSIS — Z299 Encounter for prophylactic measures, unspecified: Secondary | ICD-10-CM | POA: Diagnosis not present

## 2020-03-26 DIAGNOSIS — Z87891 Personal history of nicotine dependence: Secondary | ICD-10-CM | POA: Diagnosis not present

## 2020-06-08 IMAGING — US US RENAL
1 series · 14 of 25 positions shown · non-contrast
Comparison: None.

CLINICAL DATA: Frequent urination, history nephrolithiasis,
pressure x3 days

EXAM:
RENAL / URINARY TRACT ULTRASOUND COMPLETE

[Series 1: us renal · 0.19mm/px · 14 of 66 slices shown]
[im 1/66]
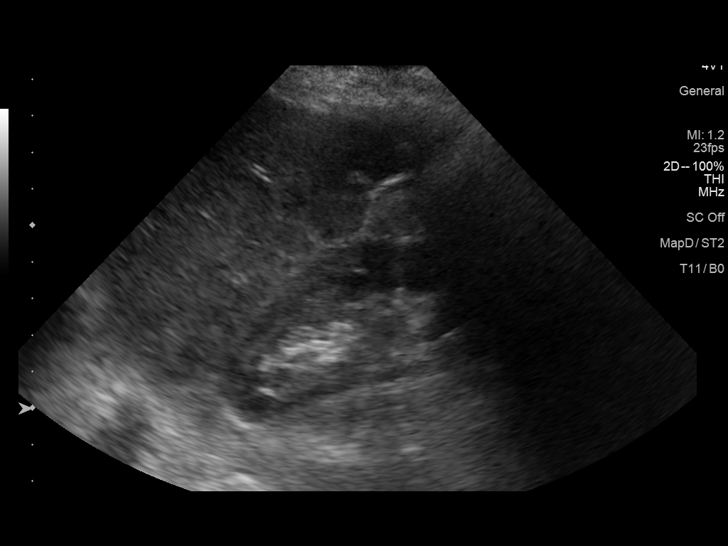
[im 6/66]
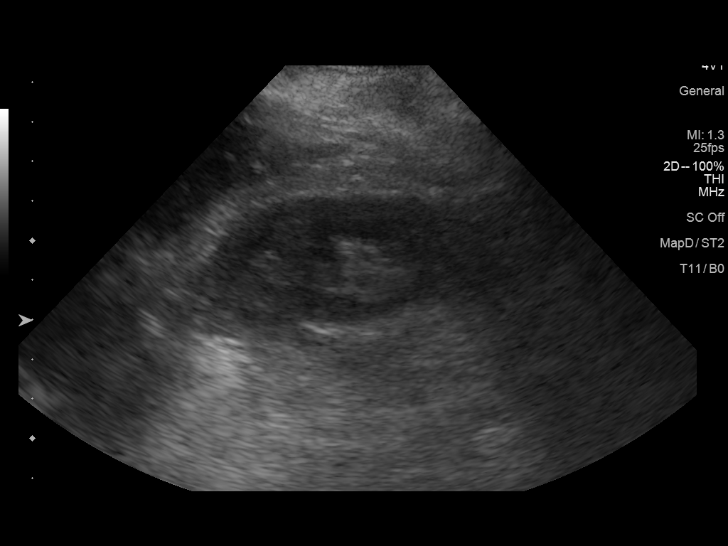
[im 11/66]
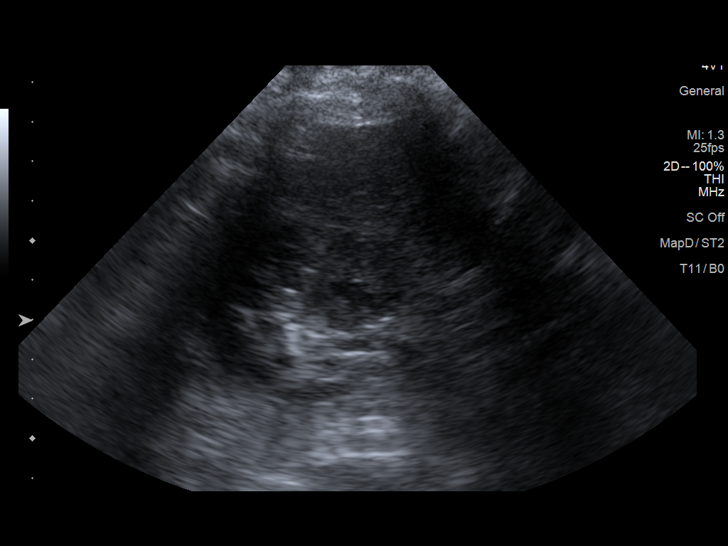
[im 17/66]
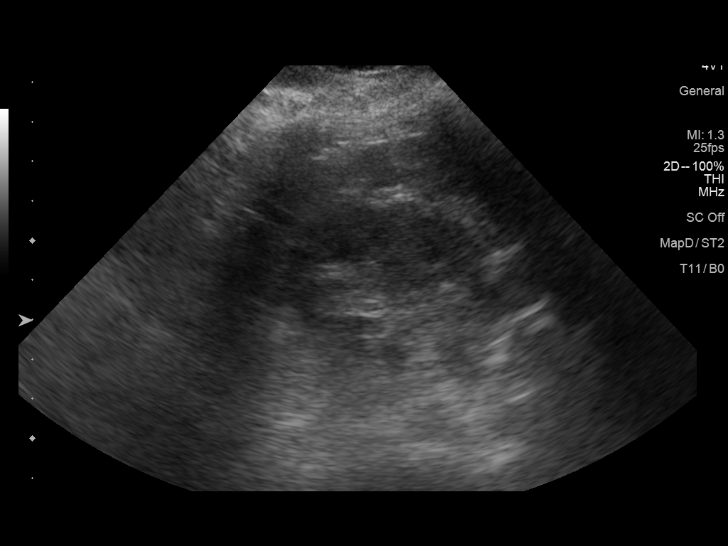
[im 22/66]
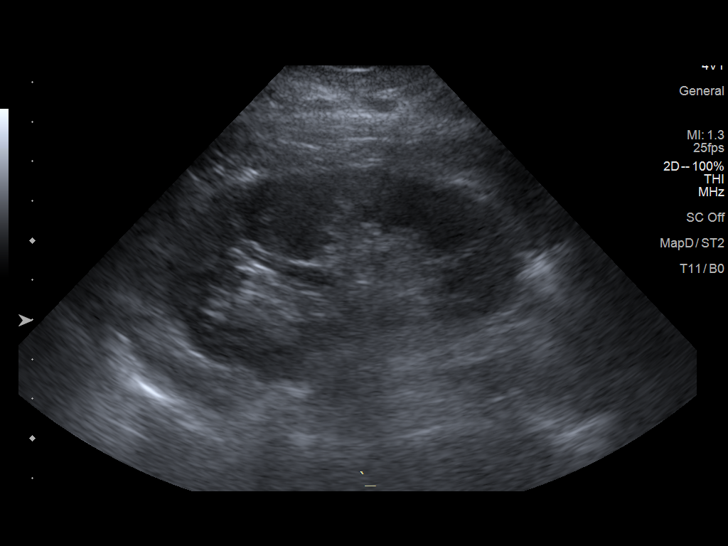
[im 25/66]
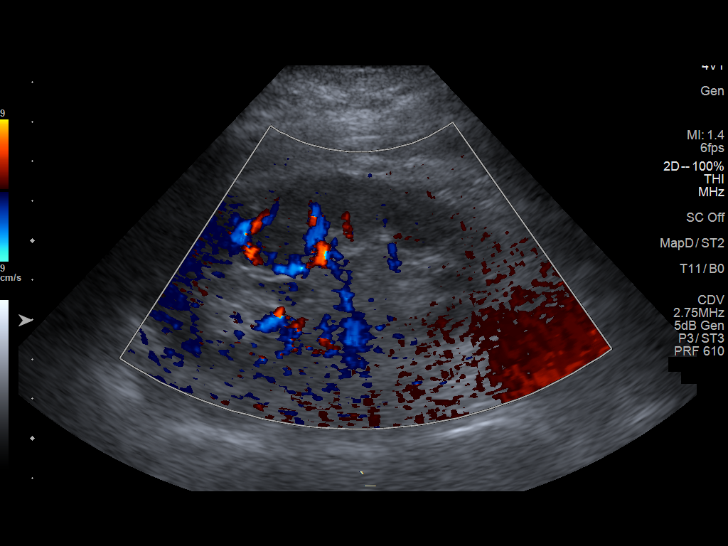
[im 30/66]
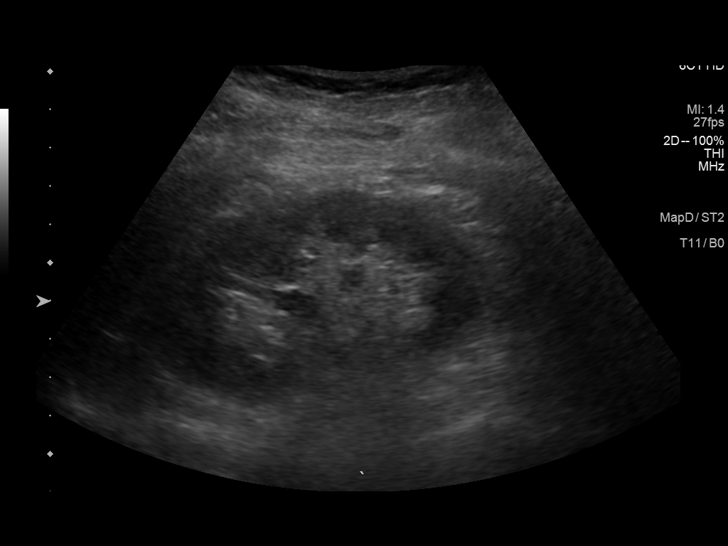
[im 36/66]
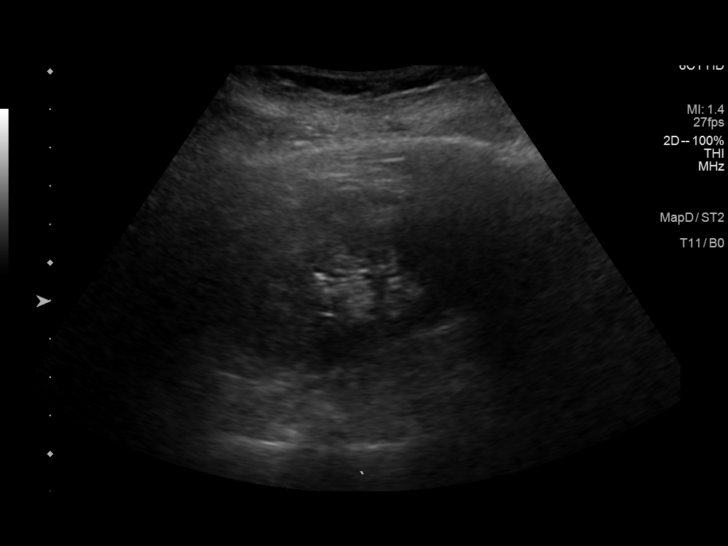
[im 41/66]
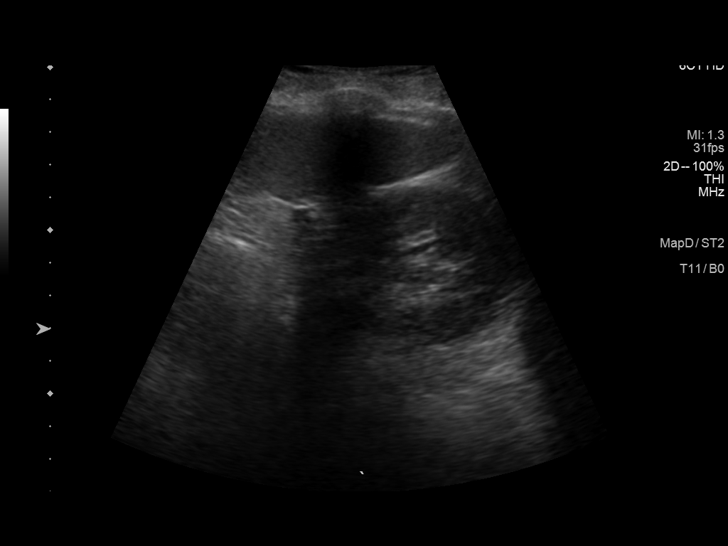
[im 44/66]
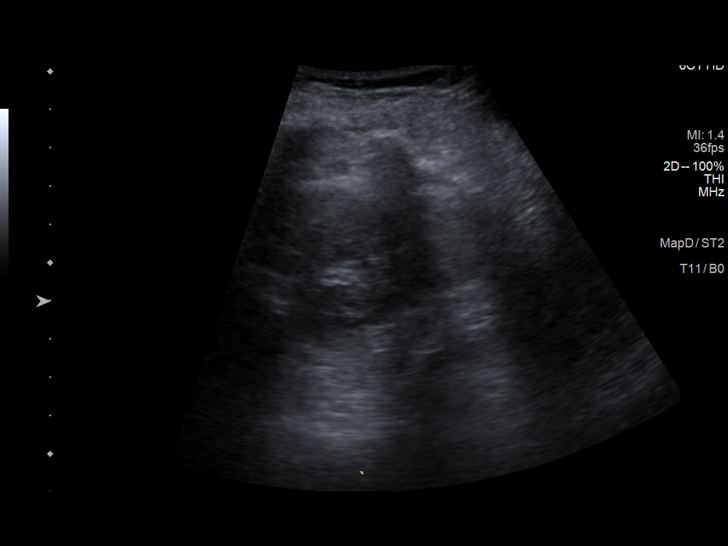
[im 49/66]
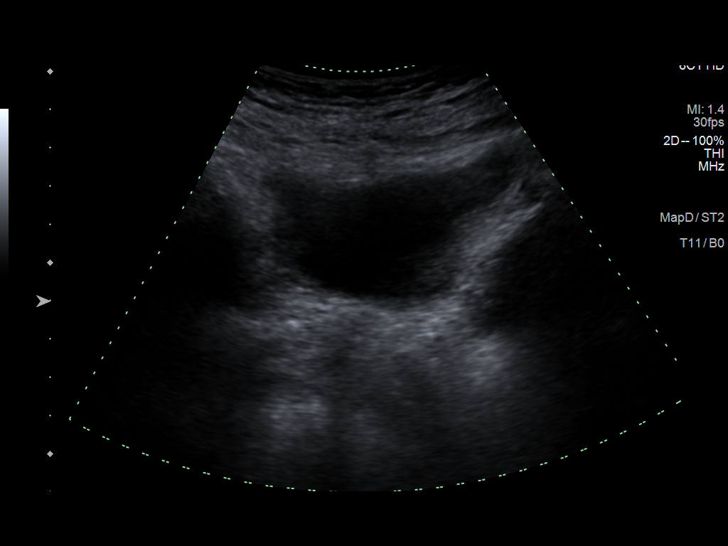
[im 55/66]
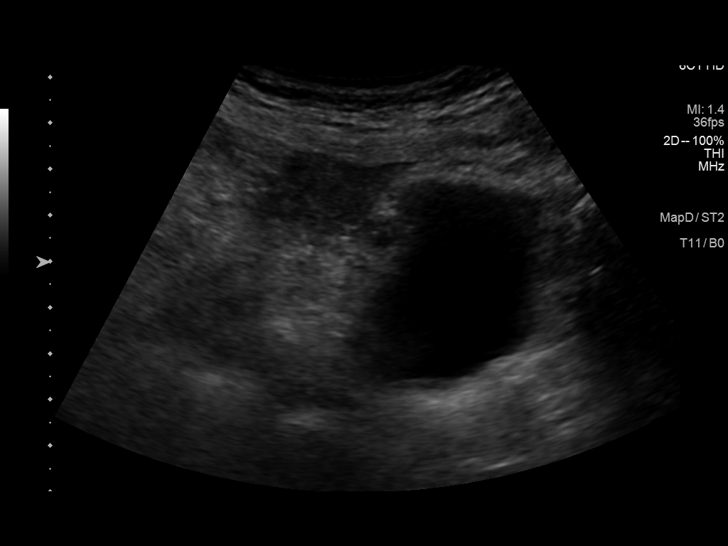
[im 60/66]
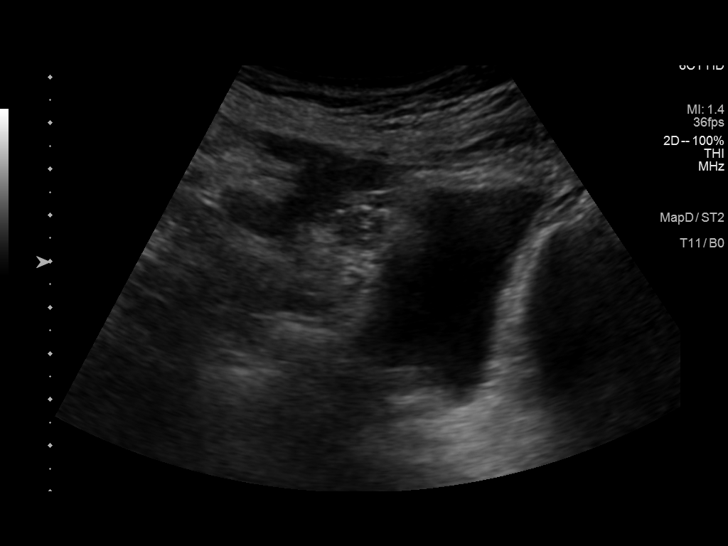
[im 66/66]
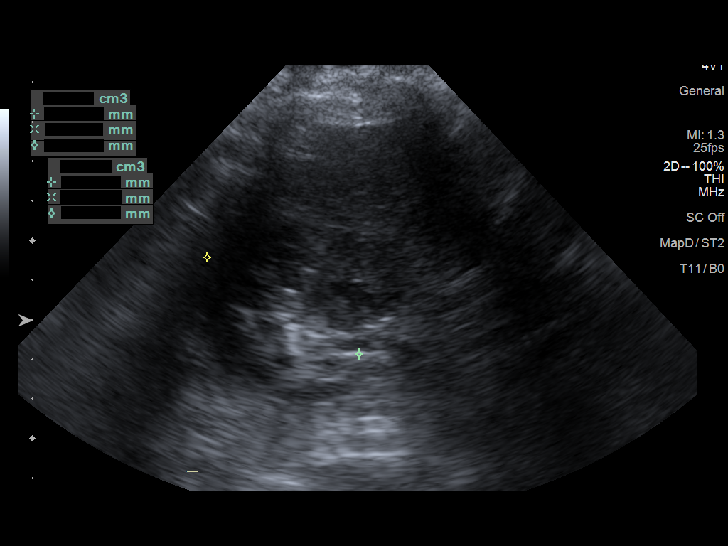

[14 of 25 positions shown; findings below may reference images not displayed]

FINDINGS: Right Kidney:

Renal measurements: 9.1 x 4.6 x 4.6 cm = volume: 99 mL .
Echogenicity within normal limits. No mass or hydronephrosis
visualized.

Left Kidney:

Renal measurements: 8.2 x 3.9 x 4.6 cm = volume: 76 mL. Echogenicity
within normal limits. No mass or hydronephrosis visualized.

Bladder:

Appears normal for degree of bladder distention.
IMPRESSION: Negative.  No hydronephrosis.

## 2020-09-03 DIAGNOSIS — Z0000 Encounter for general adult medical examination without abnormal findings: Secondary | ICD-10-CM | POA: Diagnosis not present

## 2020-09-03 DIAGNOSIS — E559 Vitamin D deficiency, unspecified: Secondary | ICD-10-CM | POA: Diagnosis not present

## 2020-09-03 DIAGNOSIS — Z79899 Other long term (current) drug therapy: Secondary | ICD-10-CM | POA: Diagnosis not present

## 2020-09-03 DIAGNOSIS — Z1331 Encounter for screening for depression: Secondary | ICD-10-CM | POA: Diagnosis not present

## 2020-09-03 DIAGNOSIS — R5383 Other fatigue: Secondary | ICD-10-CM | POA: Diagnosis not present

## 2020-09-03 DIAGNOSIS — Z299 Encounter for prophylactic measures, unspecified: Secondary | ICD-10-CM | POA: Diagnosis not present

## 2020-09-03 DIAGNOSIS — Z7189 Other specified counseling: Secondary | ICD-10-CM | POA: Diagnosis not present

## 2020-09-03 DIAGNOSIS — E039 Hypothyroidism, unspecified: Secondary | ICD-10-CM | POA: Diagnosis not present

## 2020-09-03 DIAGNOSIS — Z1339 Encounter for screening examination for other mental health and behavioral disorders: Secondary | ICD-10-CM | POA: Diagnosis not present

## 2020-09-03 DIAGNOSIS — Z6823 Body mass index (BMI) 23.0-23.9, adult: Secondary | ICD-10-CM | POA: Diagnosis not present

## 2020-09-03 DIAGNOSIS — I10 Essential (primary) hypertension: Secondary | ICD-10-CM | POA: Diagnosis not present

## 2020-09-03 DIAGNOSIS — E7800 Pure hypercholesterolemia, unspecified: Secondary | ICD-10-CM | POA: Diagnosis not present

## 2020-09-03 DIAGNOSIS — Z789 Other specified health status: Secondary | ICD-10-CM | POA: Diagnosis not present

## 2020-09-18 DIAGNOSIS — E875 Hyperkalemia: Secondary | ICD-10-CM | POA: Diagnosis not present

## 2021-03-11 DIAGNOSIS — I10 Essential (primary) hypertension: Secondary | ICD-10-CM | POA: Diagnosis not present

## 2021-03-11 DIAGNOSIS — Z299 Encounter for prophylactic measures, unspecified: Secondary | ICD-10-CM | POA: Diagnosis not present

## 2021-03-11 DIAGNOSIS — E039 Hypothyroidism, unspecified: Secondary | ICD-10-CM | POA: Diagnosis not present

## 2021-03-11 DIAGNOSIS — I809 Phlebitis and thrombophlebitis of unspecified site: Secondary | ICD-10-CM | POA: Diagnosis not present

## 2021-03-11 DIAGNOSIS — Z6823 Body mass index (BMI) 23.0-23.9, adult: Secondary | ICD-10-CM | POA: Diagnosis not present

## 2021-04-22 DIAGNOSIS — E039 Hypothyroidism, unspecified: Secondary | ICD-10-CM | POA: Diagnosis not present

## 2021-07-01 DIAGNOSIS — E039 Hypothyroidism, unspecified: Secondary | ICD-10-CM | POA: Diagnosis not present

## 2021-09-10 DIAGNOSIS — Z6831 Body mass index (BMI) 31.0-31.9, adult: Secondary | ICD-10-CM | POA: Diagnosis not present

## 2021-09-10 DIAGNOSIS — R5383 Other fatigue: Secondary | ICD-10-CM | POA: Diagnosis not present

## 2021-09-10 DIAGNOSIS — Z789 Other specified health status: Secondary | ICD-10-CM | POA: Diagnosis not present

## 2021-09-10 DIAGNOSIS — Z299 Encounter for prophylactic measures, unspecified: Secondary | ICD-10-CM | POA: Diagnosis not present

## 2021-09-10 DIAGNOSIS — E559 Vitamin D deficiency, unspecified: Secondary | ICD-10-CM | POA: Diagnosis not present

## 2021-09-10 DIAGNOSIS — Z1331 Encounter for screening for depression: Secondary | ICD-10-CM | POA: Diagnosis not present

## 2021-09-10 DIAGNOSIS — E7800 Pure hypercholesterolemia, unspecified: Secondary | ICD-10-CM | POA: Diagnosis not present

## 2021-09-10 DIAGNOSIS — Z79899 Other long term (current) drug therapy: Secondary | ICD-10-CM | POA: Diagnosis not present

## 2021-09-10 DIAGNOSIS — I10 Essential (primary) hypertension: Secondary | ICD-10-CM | POA: Diagnosis not present

## 2021-09-10 DIAGNOSIS — E039 Hypothyroidism, unspecified: Secondary | ICD-10-CM | POA: Diagnosis not present

## 2021-09-10 DIAGNOSIS — Z7189 Other specified counseling: Secondary | ICD-10-CM | POA: Diagnosis not present

## 2021-09-10 DIAGNOSIS — Z0000 Encounter for general adult medical examination without abnormal findings: Secondary | ICD-10-CM | POA: Diagnosis not present

## 2021-09-10 DIAGNOSIS — Z1339 Encounter for screening examination for other mental health and behavioral disorders: Secondary | ICD-10-CM | POA: Diagnosis not present

## 2021-09-12 DIAGNOSIS — M859 Disorder of bone density and structure, unspecified: Secondary | ICD-10-CM | POA: Diagnosis not present

## 2021-09-12 DIAGNOSIS — E2839 Other primary ovarian failure: Secondary | ICD-10-CM | POA: Diagnosis not present

## 2021-09-12 DIAGNOSIS — M818 Other osteoporosis without current pathological fracture: Secondary | ICD-10-CM | POA: Diagnosis not present

## 2021-09-12 DIAGNOSIS — Z79899 Other long term (current) drug therapy: Secondary | ICD-10-CM | POA: Diagnosis not present

## 2022-05-13 DIAGNOSIS — I10 Essential (primary) hypertension: Secondary | ICD-10-CM | POA: Diagnosis not present

## 2022-05-13 DIAGNOSIS — E039 Hypothyroidism, unspecified: Secondary | ICD-10-CM | POA: Diagnosis not present

## 2022-05-13 DIAGNOSIS — Z299 Encounter for prophylactic measures, unspecified: Secondary | ICD-10-CM | POA: Diagnosis not present

## 2022-09-17 DIAGNOSIS — Z1339 Encounter for screening examination for other mental health and behavioral disorders: Secondary | ICD-10-CM | POA: Diagnosis not present

## 2022-09-17 DIAGNOSIS — I10 Essential (primary) hypertension: Secondary | ICD-10-CM | POA: Diagnosis not present

## 2022-09-17 DIAGNOSIS — Z1331 Encounter for screening for depression: Secondary | ICD-10-CM | POA: Diagnosis not present

## 2022-09-17 DIAGNOSIS — R5383 Other fatigue: Secondary | ICD-10-CM | POA: Diagnosis not present

## 2022-09-17 DIAGNOSIS — E039 Hypothyroidism, unspecified: Secondary | ICD-10-CM | POA: Diagnosis not present

## 2022-09-17 DIAGNOSIS — Z7189 Other specified counseling: Secondary | ICD-10-CM | POA: Diagnosis not present

## 2022-09-17 DIAGNOSIS — E7800 Pure hypercholesterolemia, unspecified: Secondary | ICD-10-CM | POA: Diagnosis not present

## 2022-09-17 DIAGNOSIS — M810 Age-related osteoporosis without current pathological fracture: Secondary | ICD-10-CM | POA: Diagnosis not present

## 2022-09-17 DIAGNOSIS — Z79899 Other long term (current) drug therapy: Secondary | ICD-10-CM | POA: Diagnosis not present

## 2022-09-17 DIAGNOSIS — Z299 Encounter for prophylactic measures, unspecified: Secondary | ICD-10-CM | POA: Diagnosis not present

## 2022-09-17 DIAGNOSIS — Z0000 Encounter for general adult medical examination without abnormal findings: Secondary | ICD-10-CM | POA: Diagnosis not present

## 2023-02-04 DIAGNOSIS — Z299 Encounter for prophylactic measures, unspecified: Secondary | ICD-10-CM | POA: Diagnosis not present

## 2023-02-04 DIAGNOSIS — I10 Essential (primary) hypertension: Secondary | ICD-10-CM | POA: Diagnosis not present

## 2023-02-04 DIAGNOSIS — T63301A Toxic effect of unspecified spider venom, accidental (unintentional), initial encounter: Secondary | ICD-10-CM | POA: Diagnosis not present

## 2023-04-13 DIAGNOSIS — I4891 Unspecified atrial fibrillation: Secondary | ICD-10-CM | POA: Diagnosis not present

## 2023-04-13 DIAGNOSIS — Z299 Encounter for prophylactic measures, unspecified: Secondary | ICD-10-CM | POA: Diagnosis not present

## 2023-04-13 DIAGNOSIS — I10 Essential (primary) hypertension: Secondary | ICD-10-CM | POA: Diagnosis not present

## 2023-04-19 DIAGNOSIS — R9431 Abnormal electrocardiogram [ECG] [EKG]: Secondary | ICD-10-CM | POA: Diagnosis not present

## 2023-04-19 DIAGNOSIS — I480 Paroxysmal atrial fibrillation: Secondary | ICD-10-CM | POA: Diagnosis not present

## 2023-04-27 DIAGNOSIS — I429 Cardiomyopathy, unspecified: Secondary | ICD-10-CM | POA: Diagnosis not present

## 2023-04-27 DIAGNOSIS — I4891 Unspecified atrial fibrillation: Secondary | ICD-10-CM | POA: Diagnosis not present

## 2023-04-27 DIAGNOSIS — Z299 Encounter for prophylactic measures, unspecified: Secondary | ICD-10-CM | POA: Diagnosis not present

## 2023-04-27 DIAGNOSIS — I10 Essential (primary) hypertension: Secondary | ICD-10-CM | POA: Diagnosis not present

## 2023-05-18 DIAGNOSIS — I4891 Unspecified atrial fibrillation: Secondary | ICD-10-CM | POA: Diagnosis not present

## 2023-05-18 DIAGNOSIS — R0602 Shortness of breath: Secondary | ICD-10-CM | POA: Diagnosis not present

## 2023-05-18 DIAGNOSIS — I429 Cardiomyopathy, unspecified: Secondary | ICD-10-CM | POA: Diagnosis not present

## 2023-05-18 DIAGNOSIS — Z299 Encounter for prophylactic measures, unspecified: Secondary | ICD-10-CM | POA: Diagnosis not present

## 2023-05-18 DIAGNOSIS — R059 Cough, unspecified: Secondary | ICD-10-CM | POA: Diagnosis not present

## 2023-05-18 DIAGNOSIS — R0989 Other specified symptoms and signs involving the circulatory and respiratory systems: Secondary | ICD-10-CM | POA: Diagnosis not present

## 2023-05-18 DIAGNOSIS — R918 Other nonspecific abnormal finding of lung field: Secondary | ICD-10-CM | POA: Diagnosis not present

## 2023-05-18 DIAGNOSIS — R0981 Nasal congestion: Secondary | ICD-10-CM | POA: Diagnosis not present

## 2023-05-18 DIAGNOSIS — J069 Acute upper respiratory infection, unspecified: Secondary | ICD-10-CM | POA: Diagnosis not present

## 2023-05-25 DIAGNOSIS — I4891 Unspecified atrial fibrillation: Secondary | ICD-10-CM | POA: Diagnosis not present

## 2023-05-25 DIAGNOSIS — I429 Cardiomyopathy, unspecified: Secondary | ICD-10-CM | POA: Diagnosis not present

## 2023-05-25 DIAGNOSIS — Z299 Encounter for prophylactic measures, unspecified: Secondary | ICD-10-CM | POA: Diagnosis not present

## 2023-05-25 DIAGNOSIS — I10 Essential (primary) hypertension: Secondary | ICD-10-CM | POA: Diagnosis not present

## 2023-05-25 DIAGNOSIS — R0602 Shortness of breath: Secondary | ICD-10-CM | POA: Diagnosis not present

## 2023-05-25 DIAGNOSIS — J189 Pneumonia, unspecified organism: Secondary | ICD-10-CM | POA: Diagnosis not present

## 2023-06-01 DIAGNOSIS — I10 Essential (primary) hypertension: Secondary | ICD-10-CM | POA: Diagnosis not present

## 2023-06-01 DIAGNOSIS — I4891 Unspecified atrial fibrillation: Secondary | ICD-10-CM | POA: Diagnosis not present

## 2023-06-01 DIAGNOSIS — E039 Hypothyroidism, unspecified: Secondary | ICD-10-CM | POA: Diagnosis not present

## 2023-06-01 DIAGNOSIS — R5383 Other fatigue: Secondary | ICD-10-CM | POA: Diagnosis not present

## 2023-06-01 DIAGNOSIS — Z299 Encounter for prophylactic measures, unspecified: Secondary | ICD-10-CM | POA: Diagnosis not present

## 2023-06-01 DIAGNOSIS — J189 Pneumonia, unspecified organism: Secondary | ICD-10-CM | POA: Diagnosis not present

## 2023-06-15 DIAGNOSIS — J188 Other pneumonia, unspecified organism: Secondary | ICD-10-CM | POA: Diagnosis not present

## 2023-06-15 DIAGNOSIS — R918 Other nonspecific abnormal finding of lung field: Secondary | ICD-10-CM | POA: Diagnosis not present

## 2023-06-15 DIAGNOSIS — J984 Other disorders of lung: Secondary | ICD-10-CM | POA: Diagnosis not present

## 2023-06-15 DIAGNOSIS — J189 Pneumonia, unspecified organism: Secondary | ICD-10-CM | POA: Diagnosis not present

## 2023-06-16 DIAGNOSIS — Z299 Encounter for prophylactic measures, unspecified: Secondary | ICD-10-CM | POA: Diagnosis not present

## 2023-06-16 DIAGNOSIS — J189 Pneumonia, unspecified organism: Secondary | ICD-10-CM | POA: Diagnosis not present

## 2023-06-16 DIAGNOSIS — I10 Essential (primary) hypertension: Secondary | ICD-10-CM | POA: Diagnosis not present

## 2023-06-28 ENCOUNTER — Encounter: Payer: Self-pay | Admitting: Internal Medicine

## 2023-07-02 ENCOUNTER — Ambulatory Visit: Attending: Internal Medicine | Admitting: Internal Medicine

## 2023-07-02 ENCOUNTER — Encounter: Payer: Self-pay | Admitting: Internal Medicine

## 2023-07-02 VITALS — BP 132/78 | HR 70 | Ht 61.0 in | Wt 109.2 lb

## 2023-07-02 DIAGNOSIS — Z7901 Long term (current) use of anticoagulants: Secondary | ICD-10-CM | POA: Insufficient documentation

## 2023-07-02 DIAGNOSIS — I4891 Unspecified atrial fibrillation: Secondary | ICD-10-CM | POA: Diagnosis not present

## 2023-07-02 DIAGNOSIS — I480 Paroxysmal atrial fibrillation: Secondary | ICD-10-CM | POA: Insufficient documentation

## 2023-07-02 MED ORDER — APIXABAN 2.5 MG PO TABS: 2.5000 mg | ORAL_TABLET | Freq: Two times a day (BID) | ORAL | Status: DC

## 2023-07-02 MED ORDER — METOPROLOL SUCCINATE ER 25 MG PO TB24: 12.5000 mg | ORAL_TABLET | Freq: Every day | ORAL | Status: DC

## 2023-07-02 NOTE — Progress Notes (Signed)
 Cardiology Office Note  Date: 07/02/2023   ID: Bethany Villegas, DOB [birth year], MRN [id number]  PCP:  Bethany Bitters, MD  Cardiologist:  None Electrophysiologist:  None   History of Present Illness: Bethany Villegas is a 89 y.o. female known to have paroxysmal A-fib, Graves' disease s/p RAI around 30 years ago c/w hypothyroidism was referred to cardiology clinic for evaluation of A-fib.  Patient had new onset A-fib with RVR diagnosed at her PCPs office in February 2025.  She went to her PCPs office in February 2025 with a chief complaint of cough and congestion.  EKG showed new onset A-fib with RVR, her PCP started her on metoprolol succinate 25 mg once daily and Eliquis 5 mg twice daily.  She underwent echocardiogram subsequently that showed LVEF 50 to 55%, mild diastolic dysfunction, mild MR, aortic root dilatation 3.8 cm.  She is here to establish care.  She continues to work at [employer].  Able to walk with no walker or cane.  Asymptomatic, no palpitations, SOB or fatigue.  No angina, dizziness, syncope.  EKG today showed NSR with occasional PACs.  Currently on metoprolol succinate 25 mg once daily and Eliquis 5 mg twice daily.  She reported that she was diagnosed with pneumonia around February to March 2025 and took a lot of time to recover.  Past Medical History:  Diagnosis Date   GERD (gastroesophageal reflux disease)    Graves disease    Hypothyroid    Kidney stones     Past Surgical History:  Procedure Laterality Date   ENDOVENOUS ABLATION SAPHENOUS VEIN W/ LASER Left 11/08/2019   endovenous laser ablation L GSV and stab phlebectomy 10-20 incisions left leg by Stacia Dynes MD    ESOPHAGOGASTRODUODENOSCOPY N/A 08/23/2015   Procedure: ESOPHAGOGASTRODUODENOSCOPY (EGD);  Surgeon: Ruby Corporal, MD;  Location: [facility];  Service: Endoscopy;  Laterality: N/A;  235-moved to 220 Ann to notify pt   TOTAL ABDOMINAL HYSTERECTOMY      Current Outpatient Medications   Medication Sig Dispense Refill   calcium carbonate (OS-CAL) 1250 (500 Ca) MG chewable tablet Chew 1 tablet by mouth daily.     doxycycline  (VIBRAMYCIN ) 100 MG capsule Take 1 capsule (100 mg total) by mouth 2 (two) times daily. (Patient not taking: Reported on 11/15/2019) 8 capsule 0   levothyroxine (SYNTHROID) 75 MCG tablet Take 75 mcg by mouth daily before breakfast.     LORazepam  (ATIVAN ) 1 MG tablet Take 1 tablet 30 minutes prior to leaving home on day of procedure and bring second tablet with you to the office. (Patient not taking: Reported on 11/15/2019) 2 tablet 0   multivitamin-iron-minerals-folic acid (CENTRUM) chewable tablet Chew 1 tablet by mouth daily.     ondansetron  (ZOFRAN  ODT) 4 MG disintegrating tablet Take 1 tablet (4 mg total) by mouth every 8 (eight) hours as needed for nausea. (Patient not taking: Reported on 10/13/2019) 10 tablet 0   pantoprazole  (PROTONIX ) 40 MG tablet Take 1 tablet (40 mg total) by mouth 2 (two) times daily before a meal. (Patient not taking: Reported on 10/13/2019) 60 tablet 2   Vitamin D, Ergocalciferol, (DRISDOL) 1.25 MG (50000 UT) CAPS capsule Take 50,000 Units by mouth once a week.     No current facility-administered medications for this visit.   Allergies:  Penicillins and Sulfa antibiotics   Social History: The patient  reports that she has never smoked. She has never used smokeless tobacco. She reports that she does not drink alcohol  and  does not use drugs.   Family History: The patient's family history is not on file.   ROS:  Please see the history of present illness. Otherwise, complete review of systems is positive for none  All other systems are reviewed and negative.   Physical Exam: VS:  There were no vitals taken for this visit., BMI There is no height or weight on file to calculate BMI.  Wt Readings from Last 3 Encounters:  11/15/19 112 lb (50.8 kg)  11/08/19 112 lb (50.8 kg)  10/18/19 113 lb (51.3 kg)    General: Patient appears  comfortable at rest. HEENT: Conjunctiva and lids normal, oropharynx clear with moist mucosa. Neck: Supple, no elevated JVP or carotid bruits, no thyromegaly. Lungs: Clear to auscultation, nonlabored breathing at rest. Cardiac: Regular rate and rhythm, no S3 or significant systolic murmur, no pericardial rub. Abdomen: Soft, nontender, no hepatomegaly, bowel sounds present, no guarding or rebound. Extremities: No pitting edema, distal pulses 2+. Skin: Warm and dry. Musculoskeletal: No kyphosis. Neuropsychiatric: Alert and oriented x3, affect grossly appropriate.  Recent Labwork: No results found for requested labs within last 365 days.  No results found for: "CHOL", "TRIG", "HDL", "CHOLHDL", "VLDL", "LDLCALC", "LDLDIRECT"  Other Studies Reviewed Today: Reviewed PCP notes from the EMR and echocardiogram report which the patient brought with her.  I reviewed and discussed the echocardiogram report with the patient.  Assessment and Plan:   Paroxysmal A-fib: New onset in February 2025.  EKG today showed NSR, occasional PACs.  Decrease the dose of metoprolol succinate from 25 to 12.5 mg once daily and decrease Eliquis dose from 5 mg to 2.5 mg twice daily (more than 8 years and less than 60 kg).  Eliquis is cost prohibitive, will check if we can get her Eliquis at a lower price.  I reviewed echocardiogram that showed low normal LVEF, 50 to 55%, mild MR and no significant valvular heart disease.  Aortic root dilatation: Aortic root is mildly dilated, 3.8 cm which is at the upper limit of the normal and no further workup is needed at this time.  Graves' disease s/p RAI around 30 years ago c/w hypothyroidism: Previously on Synthroid 75 mcg that was increased to 88 mcg by her PCP.      Medication Adjustments/Labs and Tests Ordered: Current medicines are reviewed at length with the patient today.  Concerns regarding medicines are outlined above.    Disposition:  Follow up 1 year  Signed Elener Custodio  Priya Jael Kostick, MD, 07/02/2023 2:55 PM    [facility] [address] [place], [place], [place] [address]

## 2023-07-02 NOTE — Patient Instructions (Addendum)
 Medication Instructions:   Decrease Eliquis to 2.5mg  twice a day   Decrease Metoprolol to 12.5mg  daily  Continue all other medications.     Labwork:  none  Testing/Procedures:  none  Follow-Up:  Your physician wants you to follow up in:  1 year.  You should receive a recall letter in the mail about 2 months prior to the time you are due.  If you don't receive this, please call our office to schedule your follow up appointment.      Any Other Special Instructions Will Be Listed Below (If Applicable).   If you need a refill on your cardiac medications before your next appointment, please call your pharmacy.

## 2023-08-10 DIAGNOSIS — E039 Hypothyroidism, unspecified: Secondary | ICD-10-CM | POA: Diagnosis not present

## 2023-08-10 DIAGNOSIS — Z299 Encounter for prophylactic measures, unspecified: Secondary | ICD-10-CM | POA: Diagnosis not present

## 2023-08-10 DIAGNOSIS — I10 Essential (primary) hypertension: Secondary | ICD-10-CM | POA: Diagnosis not present

## 2023-09-21 DIAGNOSIS — I10 Essential (primary) hypertension: Secondary | ICD-10-CM | POA: Diagnosis not present

## 2023-09-21 DIAGNOSIS — R0602 Shortness of breath: Secondary | ICD-10-CM | POA: Diagnosis not present

## 2023-09-21 DIAGNOSIS — Z299 Encounter for prophylactic measures, unspecified: Secondary | ICD-10-CM | POA: Diagnosis not present

## 2023-09-21 DIAGNOSIS — R0609 Other forms of dyspnea: Secondary | ICD-10-CM | POA: Diagnosis not present

## 2023-09-21 DIAGNOSIS — I4891 Unspecified atrial fibrillation: Secondary | ICD-10-CM | POA: Diagnosis not present

## 2023-09-28 ENCOUNTER — Inpatient Hospital Stay (HOSPITAL_COMMUNITY)
Admission: EM | Admit: 2023-09-28 | Discharge: 2023-10-02 | DRG: 293 | Disposition: A | Discharge status: Home Health | Attending: Internal Medicine | Admitting: Internal Medicine

## 2023-09-28 ENCOUNTER — Encounter (HOSPITAL_COMMUNITY): Payer: Self-pay

## 2023-09-28 ENCOUNTER — Other Ambulatory Visit: Payer: Self-pay

## 2023-09-28 ENCOUNTER — Emergency Department (HOSPITAL_COMMUNITY)

## 2023-09-28 DIAGNOSIS — R7989 Other specified abnormal findings of blood chemistry: Secondary | ICD-10-CM | POA: Diagnosis present

## 2023-09-28 DIAGNOSIS — I5033 Acute on chronic diastolic (congestive) heart failure: Secondary | ICD-10-CM | POA: Insufficient documentation

## 2023-09-28 DIAGNOSIS — I5021 Acute systolic (congestive) heart failure: Secondary | ICD-10-CM | POA: Diagnosis present

## 2023-09-28 DIAGNOSIS — Z8701 Personal history of pneumonia (recurrent): Secondary | ICD-10-CM

## 2023-09-28 DIAGNOSIS — Z880 Allergy status to penicillin: Secondary | ICD-10-CM

## 2023-09-28 DIAGNOSIS — R0600 Dyspnea, unspecified: Secondary | ICD-10-CM | POA: Diagnosis not present

## 2023-09-28 DIAGNOSIS — I480 Paroxysmal atrial fibrillation: Secondary | ICD-10-CM | POA: Diagnosis present

## 2023-09-28 DIAGNOSIS — Z79890 Hormone replacement therapy (postmenopausal): Secondary | ICD-10-CM | POA: Diagnosis not present

## 2023-09-28 DIAGNOSIS — R0902 Hypoxemia: Secondary | ICD-10-CM | POA: Diagnosis not present

## 2023-09-28 DIAGNOSIS — I77810 Thoracic aortic ectasia: Secondary | ICD-10-CM | POA: Diagnosis present

## 2023-09-28 DIAGNOSIS — Z1152 Encounter for screening for COVID-19: Secondary | ICD-10-CM | POA: Diagnosis not present

## 2023-09-28 DIAGNOSIS — Z882 Allergy status to sulfonamides status: Secondary | ICD-10-CM

## 2023-09-28 DIAGNOSIS — I700 Atherosclerosis of aorta: Secondary | ICD-10-CM | POA: Diagnosis not present

## 2023-09-28 DIAGNOSIS — K219 Gastro-esophageal reflux disease without esophagitis: Secondary | ICD-10-CM | POA: Diagnosis not present

## 2023-09-28 DIAGNOSIS — Z87442 Personal history of urinary calculi: Secondary | ICD-10-CM

## 2023-09-28 DIAGNOSIS — R918 Other nonspecific abnormal finding of lung field: Secondary | ICD-10-CM | POA: Diagnosis not present

## 2023-09-28 DIAGNOSIS — E559 Vitamin D deficiency, unspecified: Secondary | ICD-10-CM | POA: Diagnosis not present

## 2023-09-28 DIAGNOSIS — I4891 Unspecified atrial fibrillation: Secondary | ICD-10-CM

## 2023-09-28 DIAGNOSIS — I517 Cardiomegaly: Secondary | ICD-10-CM | POA: Diagnosis not present

## 2023-09-28 DIAGNOSIS — E876 Hypokalemia: Secondary | ICD-10-CM | POA: Diagnosis present

## 2023-09-28 DIAGNOSIS — I081 Rheumatic disorders of both mitral and tricuspid valves: Secondary | ICD-10-CM | POA: Diagnosis present

## 2023-09-28 DIAGNOSIS — I959 Hypotension, unspecified: Secondary | ICD-10-CM | POA: Diagnosis not present

## 2023-09-28 DIAGNOSIS — E0590 Thyrotoxicosis, unspecified without thyrotoxic crisis or storm: Secondary | ICD-10-CM | POA: Diagnosis not present

## 2023-09-28 DIAGNOSIS — Z90710 Acquired absence of both cervix and uterus: Secondary | ICD-10-CM | POA: Diagnosis not present

## 2023-09-28 DIAGNOSIS — E0500 Thyrotoxicosis with diffuse goiter without thyrotoxic crisis or storm: Secondary | ICD-10-CM | POA: Diagnosis present

## 2023-09-28 DIAGNOSIS — R0602 Shortness of breath: Secondary | ICD-10-CM | POA: Diagnosis not present

## 2023-09-28 DIAGNOSIS — Z7901 Long term (current) use of anticoagulants: Secondary | ICD-10-CM

## 2023-09-28 DIAGNOSIS — I509 Heart failure, unspecified: Secondary | ICD-10-CM

## 2023-09-28 DIAGNOSIS — Z79899 Other long term (current) drug therapy: Secondary | ICD-10-CM

## 2023-09-28 DIAGNOSIS — R5383 Other fatigue: Secondary | ICD-10-CM | POA: Diagnosis not present

## 2023-09-28 DIAGNOSIS — J90 Pleural effusion, not elsewhere classified: Secondary | ICD-10-CM | POA: Diagnosis not present

## 2023-09-28 DIAGNOSIS — Z299 Encounter for prophylactic measures, unspecified: Secondary | ICD-10-CM | POA: Diagnosis not present

## 2023-09-28 HISTORY — DX: Unspecified atrial fibrillation: I48.91

## 2023-09-28 LAB — T4, FREE: Free T4: 1.35 ng/dL — ABNORMAL HIGH (ref 0.61–1.12)

## 2023-09-28 LAB — CBC WITH DIFFERENTIAL/PLATELET
Abs Immature Granulocytes: 0.02 K/uL (ref 0.00–0.07)
Basophils Absolute: 0.1 K/uL (ref 0.0–0.1)
Basophils Relative: 1 %
Eosinophils Absolute: 0.1 K/uL (ref 0.0–0.5)
Eosinophils Relative: 1 %
HCT: 41.6 % (ref 36.0–46.0)
Hemoglobin: 13.5 g/dL (ref 12.0–15.0)
Immature Granulocytes: 0 %
Lymphocytes Relative: 18 %
Lymphs Abs: 1.3 K/uL (ref 0.7–4.0)
MCH: 34.1 pg — ABNORMAL HIGH (ref 26.0–34.0)
MCHC: 32.5 g/dL (ref 30.0–36.0)
MCV: 105.1 fL — ABNORMAL HIGH (ref 80.0–100.0)
Monocytes Absolute: 0.4 K/uL (ref 0.1–1.0)
Monocytes Relative: 6 %
Neutro Abs: 5.4 K/uL (ref 1.7–7.7)
Neutrophils Relative %: 74 %
Platelets: 229 K/uL (ref 150–400)
RBC: 3.96 MIL/uL (ref 3.87–5.11)
RDW: 13.5 % (ref 11.5–15.5)
WBC: 7.3 K/uL (ref 4.0–10.5)
nRBC: 0 % (ref 0.0–0.2)

## 2023-09-28 LAB — COMPREHENSIVE METABOLIC PANEL WITH GFR
ALT: 28 U/L (ref 0–44)
AST: 35 U/L (ref 15–41)
Albumin: 3.8 g/dL (ref 3.5–5.0)
Alkaline Phosphatase: 66 U/L (ref 38–126)
Anion gap: 9 (ref 5–15)
BUN: 15 mg/dL (ref 8–23)
CO2: 23 mmol/L (ref 22–32)
Calcium: 8.9 mg/dL (ref 8.9–10.3)
Chloride: 106 mmol/L (ref 98–111)
Creatinine, Ser: 1.07 mg/dL — ABNORMAL HIGH (ref 0.44–1.00)
GFR, Estimated: 50 mL/min — ABNORMAL LOW (ref >60)
Glucose, Bld: 123 mg/dL — ABNORMAL HIGH (ref 70–99)
Potassium: 4.1 mmol/L (ref 3.5–5.1)
Sodium: 138 mmol/L (ref 135–145)
Total Bilirubin: 1.9 mg/dL — ABNORMAL HIGH (ref 0.0–1.2)
Total Protein: 6.8 g/dL (ref 6.5–8.1)

## 2023-09-28 LAB — TROPONIN I (HIGH SENSITIVITY)
Troponin I (High Sensitivity): 20 ng/L — ABNORMAL HIGH (ref <18)
Troponin I (High Sensitivity): 20 ng/L — ABNORMAL HIGH (ref <18)
Troponin I (High Sensitivity): 22 ng/L — ABNORMAL HIGH (ref <18)
Troponin I (High Sensitivity): 22 ng/L — ABNORMAL HIGH (ref <18)

## 2023-09-28 LAB — RESP PANEL BY RT-PCR (RSV, FLU A&B, COVID)  RVPGX2
Influenza A by PCR: NEGATIVE
Influenza B by PCR: NEGATIVE
Resp Syncytial Virus by PCR: NEGATIVE
SARS Coronavirus 2 by RT PCR: NEGATIVE

## 2023-09-28 LAB — TSH: TSH: 11.689 u[IU]/mL — ABNORMAL HIGH (ref 0.350–4.500)

## 2023-09-28 LAB — PROTIME-INR
INR: 1.2 (ref 0.8–1.2)
Prothrombin Time: 16.2 s — ABNORMAL HIGH (ref 11.4–15.2)

## 2023-09-28 LAB — PROCALCITONIN: Procalcitonin: <0.1 ng/mL

## 2023-09-28 LAB — LACTIC ACID, PLASMA: Lactic Acid, Venous: 1.2 mmol/L (ref 0.5–1.9)

## 2023-09-28 LAB — MAGNESIUM: Magnesium: 2.2 mg/dL (ref 1.7–2.4)

## 2023-09-28 LAB — BRAIN NATRIURETIC PEPTIDE: B Natriuretic Peptide: 1839 pg/mL — ABNORMAL HIGH (ref 0.0–100.0)

## 2023-09-28 LAB — PHOSPHORUS: Phosphorus: 3.7 mg/dL (ref 2.5–4.6)

## 2023-09-28 MED ORDER — FUROSEMIDE 10 MG/ML IJ SOLN
20.0000 mg | Freq: Once | INTRAMUSCULAR | Status: AC
  Administered 2023-09-28 (×2): 20 mg via INTRAVENOUS
  Filled 2023-09-28: qty 2

## 2023-09-28 MED ORDER — APIXABAN 2.5 MG PO TABS
2.5000 mg | ORAL_TABLET | Freq: Two times a day (BID) | ORAL | Status: DC
  Administered 2023-09-28 – 2023-10-02 (×13): 2.5 mg via ORAL
  Filled 2023-09-28 (×9): qty 1

## 2023-09-28 MED ORDER — BISACODYL 5 MG PO TBEC: 5.0000 mg | DELAYED_RELEASE_TABLET | Freq: Every day | ORAL | Status: DC | PRN

## 2023-09-28 MED ORDER — PNEUMOCOCCAL 20-VAL CONJ VACC 0.5 ML IM SUSY: 0.5000 mL | PREFILLED_SYRINGE | INTRAMUSCULAR | Status: DC

## 2023-09-28 MED ORDER — LEVALBUTEROL HCL 0.63 MG/3ML IN NEBU: 0.6300 mg | INHALATION_SOLUTION | Freq: Four times a day (QID) | RESPIRATORY_TRACT | Status: DC | PRN

## 2023-09-28 MED ORDER — SODIUM CHLORIDE 0.9% FLUSH
3.0000 mL | Freq: Two times a day (BID) | INTRAVENOUS | Status: DC
  Administered 2023-09-28 – 2023-10-02 (×9): 3 mL via INTRAVENOUS

## 2023-09-28 MED ORDER — ADULT MULTIVITAMIN W/MINERALS CH
1.0000 | ORAL_TABLET | Freq: Every day | ORAL | Status: DC
  Administered 2023-09-28 – 2023-10-02 (×7): 1 via ORAL
  Filled 2023-09-28 (×5): qty 1

## 2023-09-28 MED ORDER — HYDROMORPHONE HCL 1 MG/ML IJ SOLN: 0.5000 mg | INTRAMUSCULAR | Status: DC | PRN

## 2023-09-28 MED ORDER — VITAMIN D (ERGOCALCIFEROL) 1.25 MG (50000 UNIT) PO CAPS
50000.0000 [IU] | ORAL_CAPSULE | ORAL | Status: DC
  Administered 2023-09-29 (×2): 50000 [IU] via ORAL
  Filled 2023-09-28 (×3): qty 1

## 2023-09-28 MED ORDER — SENNOSIDES-DOCUSATE SODIUM 8.6-50 MG PO TABS: 1.0000 | ORAL_TABLET | Freq: Every evening | ORAL | Status: DC | PRN

## 2023-09-28 MED ORDER — ACETAMINOPHEN 325 MG PO TABS: 650.0000 mg | ORAL_TABLET | Freq: Four times a day (QID) | ORAL | Status: DC | PRN

## 2023-09-28 MED ORDER — ONDANSETRON HCL 4 MG/2ML IJ SOLN: 4.0000 mg | Freq: Four times a day (QID) | INTRAMUSCULAR | Status: DC | PRN

## 2023-09-28 MED ORDER — HEPARIN SODIUM (PORCINE) 5000 UNIT/ML IJ SOLN: 5000.0000 [IU] | Freq: Three times a day (TID) | INTRAMUSCULAR | Status: DC

## 2023-09-28 MED ORDER — SODIUM CHLORIDE 0.9 % IV BOLUS
500.0000 mL | Freq: Once | INTRAVENOUS | Status: AC
  Administered 2023-09-28 (×2): 500 mL via INTRAVENOUS

## 2023-09-28 MED ORDER — FLEET ENEMA RE ENEM: 1.0000 | ENEMA | Freq: Once | RECTAL | Status: DC | PRN

## 2023-09-28 MED ORDER — IPRATROPIUM BROMIDE 0.02 % IN SOLN: 0.5000 mg | Freq: Four times a day (QID) | RESPIRATORY_TRACT | Status: DC | PRN

## 2023-09-28 MED ORDER — TRAZODONE HCL 50 MG PO TABS
25.0000 mg | ORAL_TABLET | Freq: Every evening | ORAL | Status: DC | PRN
  Filled 2023-09-28: qty 1

## 2023-09-28 MED ORDER — METOPROLOL SUCCINATE ER 25 MG PO TB24
25.0000 mg | ORAL_TABLET | Freq: Every day | ORAL | Status: DC
  Administered 2023-09-28 – 2023-09-30 (×5): 25 mg via ORAL
  Filled 2023-09-28 (×3): qty 1

## 2023-09-28 MED ORDER — SODIUM CHLORIDE 0.9% FLUSH
3.0000 mL | Freq: Two times a day (BID) | INTRAVENOUS | Status: DC
  Administered 2023-09-28 – 2023-10-02 (×11): 3 mL via INTRAVENOUS

## 2023-09-28 MED ORDER — HYDRALAZINE HCL 20 MG/ML IJ SOLN: 10.0000 mg | INTRAMUSCULAR | Status: DC | PRN

## 2023-09-28 MED ORDER — OXYCODONE HCL 5 MG PO TABS: 5.0000 mg | ORAL_TABLET | ORAL | Status: DC | PRN

## 2023-09-28 MED ORDER — ONDANSETRON HCL 4 MG PO TABS
4.0000 mg | ORAL_TABLET | Freq: Four times a day (QID) | ORAL | Status: DC | PRN
Start: 2023-09-28 — End: 2023-10-02

## 2023-09-28 MED ORDER — CALCIUM CARBONATE 1250 (500 CA) MG PO TABS
1250.0000 mg | ORAL_TABLET | Freq: Every day | ORAL | Status: DC
  Administered 2023-09-29 – 2023-10-02 (×5): 1250 mg via ORAL
  Filled 2023-09-28 (×4): qty 1

## 2023-09-28 MED ORDER — VITAMIN B-12 100 MCG PO TABS
100.0000 ug | ORAL_TABLET | Freq: Every day | ORAL | Status: DC
  Administered 2023-09-28 – 2023-10-02 (×7): 100 ug via ORAL
  Filled 2023-09-28 (×5): qty 1

## 2023-09-28 MED ORDER — LEVOTHYROXINE SODIUM 50 MCG PO TABS
50.0000 ug | ORAL_TABLET | Freq: Every day | ORAL | Status: DC
  Administered 2023-09-29 – 2023-09-30 (×3): 50 ug via ORAL
  Filled 2023-09-28 (×3): qty 1

## 2023-09-28 MED ORDER — ACETAMINOPHEN 650 MG RE SUPP: 650.0000 mg | Freq: Four times a day (QID) | RECTAL | Status: DC | PRN

## 2023-09-28 MED ORDER — FUROSEMIDE 10 MG/ML IJ SOLN
40.0000 mg | Freq: Two times a day (BID) | INTRAMUSCULAR | Status: DC
  Administered 2023-09-28 – 2023-09-30 (×7): 40 mg via INTRAVENOUS
  Filled 2023-09-28 (×4): qty 4

## 2023-09-28 MED ORDER — METOPROLOL TARTRATE 5 MG/5ML IV SOLN
5.0000 mg | Freq: Once | INTRAVENOUS | Status: AC
  Administered 2023-09-28 (×2): 5 mg via INTRAVENOUS
  Filled 2023-09-28: qty 5

## 2023-09-28 NOTE — Hospital Course (Addendum)
 Mrs. Fulp was admitted to the hospital with the working diagnosis of heart failure exacerbation.   89 year old active female with a history of paroxysmal A-fib, GERD, Graves' disease, hypothyroidism, vitamin D  deficiency who presented with dyspnea.  Reported generalized weakness, and poor appetite, with progressive dyspnea. On her initial physical examination her blood pressure was 122/98, HR 103 RR 25 and 02 saturation 95%  Lungs with bilateral rales, with no wheezing, heart with S1 and S2 present irregularly irregular, with no gallops, rubs or murmurs, abdomen with no distention and no lower extremity edema.   Na 138, K 4.1 Cl 106 bicarbonate 23, glucose 123 bun 15 cr 1,0  AST 35 and ALT 28  BNP 1,839  High sensitive troponin 20  Wbc 7,3 hgb 13.5 plt 229  Sars covid 19 negative Influenza negative RSV negative   Chest radiograph with cardiomegaly with bilateral hilar vascular congestion   EKG 132 bpm, right axis deviation, qtc 439, atrial fibrillation with no significant ST segment changes, negative T wave lead II, III, avF, V4 to V6.  Patient was placed on furosemide  for diuresis  Metoprolol  was increased

## 2023-09-28 NOTE — Plan of Care (Signed)

## 2023-09-28 NOTE — ED Provider Notes (Signed)
 [place] EMERGENCY DEPARTMENT AT [facility] Provider Note   CSN: [id number] Arrival date & time: 09/28/23  1154     Patient presents with: Shortness of Breath   Bethany Villegas is a 89 y.o. female.  With a history of atrial fibrillation on Eliquis  and Graves' disease who presents to the ED for shortness of breath.  Patient has been on Eliquis  since May.  She states that she was also diagnosed with pneumonia in May and her breathing has not felt right since.  She still works at [employer] and has increasing shortness of breath with walking short distances as well as generalized weakness and decreased appetite.  No fevers chills chest pain or recent illness.  She was last seen by cardiology (Dr. Mallipeddi) in May.  At that time her Eliquis  dosing and metoprolol  dosing was decreased   HPI     Prior to Admission medications   Medication Sig Start Date End Date Taking? Authorizing Provider  apixaban  (ELIQUIS ) 2.5 MG TABS tablet Take 1 tablet (2.5 mg total) by mouth 2 (two) times daily. 07/02/23  Yes Mallipeddi, Vishnu P, MD  calcium  carbonate (OS-CAL) 1250 (500 Ca) MG chewable tablet Chew 1 tablet by mouth daily.   Yes [provider]  levothyroxine  (SYNTHROID ) 50 MCG tablet Take 50 mcg by mouth daily. 08/11/23  Yes [provider]  metoprolol  succinate (TOPROL -XL) 25 MG 24 hr tablet Take 0.5 tablets (12.5 mg total) by mouth daily. Patient taking differently: Take 25 mg by mouth daily. 07/02/23  Yes Mallipeddi, Vishnu P, MD  multivitamin-iron-minerals-folic acid (CENTRUM) chewable tablet Chew 1 tablet by mouth daily.   Yes [provider]  vitamin B-12 (CYANOCOBALAMIN ) 100 MCG tablet Take 100 mcg by mouth daily.   Yes [provider]  Vitamin D , Ergocalciferol , (DRISDOL ) 1.25 MG (50000 UT) CAPS capsule Take 50,000 Units by mouth once a week. 05/08/18  Yes [provider]    Allergies: Penicillins and Sulfa antibiotics    Review of  Systems  Updated Vital Signs BP 115/82   Pulse (!) 108   Temp (!) 97.5 F (36.4 C)   Resp 20   Ht 5' 1 (1.549 m)   Wt 52.2 kg   SpO2 92%   BMI 21.73 kg/m   Physical Exam Vitals and nursing note reviewed.  HENT:     Head: Normocephalic and atraumatic.  Eyes:     Pupils: Pupils are equal, round, and reactive to light.  Cardiovascular:     Rate and Rhythm: Tachycardia present. Rhythm irregular.  Pulmonary:     Effort: Pulmonary effort is normal.     Breath sounds: Normal breath sounds.  Abdominal:     Palpations: Abdomen is soft.     Tenderness: There is no abdominal tenderness.  Musculoskeletal:     Right lower leg: No edema.     Left lower leg: No edema.  Skin:    General: Skin is warm and dry.  Neurological:     Mental Status: She is alert.  Psychiatric:        Mood and Affect: Mood normal.     (all labs ordered are listed, but only abnormal results are displayed) Labs Reviewed  CBC WITH DIFFERENTIAL/PLATELET - Abnormal; Notable for the following components:      Result Value   MCV 105.1 (*)    MCH 34.1 (*)    All other components within normal limits  COMPREHENSIVE METABOLIC PANEL WITH GFR - Abnormal; Notable for the following components:  Glucose, Bld 123 (*)    Creatinine, Ser 1.07 (*)    Total Bilirubin 1.9 (*)    GFR, Estimated 50 (*)    All other components within normal limits  BRAIN NATRIURETIC PEPTIDE - Abnormal; Notable for the following components:   B Natriuretic Peptide 1,839.0 (*)    All other components within normal limits  TROPONIN I (HIGH SENSITIVITY) - Abnormal; Notable for the following components:   Troponin I (High Sensitivity) 20 (*)    All other components within normal limits  RESP PANEL BY RT-PCR (RSV, FLU A&B, COVID)  RVPGX2  EXPECTORATED SPUTUM ASSESSMENT W GRAM STAIN, RFLX TO RESP C  MAGNESIUM  PHOSPHORUS  PROTIME-INR  PROCALCITONIN  LACTIC ACID, PLASMA  TROPONIN I (HIGH SENSITIVITY)  TROPONIN I (HIGH SENSITIVITY)     EKG: None  Radiology: DG Chest Portable 1 View Result Date: 09/28/2023 CLINICAL DATA:  Shortness of breath. EXAM: PORTABLE CHEST 1 VIEW COMPARISON:  None Available. FINDINGS: The heart is enlarged. Mediastinal contours are within normal limits. Aortic atherosclerosis. Blunting of the bilateral costophrenic angles is suggestive of small bilateral pleural effusions. Bibasilar opacities could reflect atelectasis and/or infiltrate. No pneumothorax. No acute osseous abnormality. IMPRESSION: Cardiomegaly with blunting of the bilateral costophrenic angles, suggestive of small bilateral pleural effusions. Bibasilar opacities could reflect atelectasis and/or infiltrate. Electronically Signed   By: Harrietta Sherry M.D.   On: 09/28/2023 12:54     Procedures   Medications Ordered in the ED  sodium chloride  flush (NS) 0.9 % injection 3 mL (has no administration in time range)  sodium chloride  flush (NS) 0.9 % injection 3 mL (has no administration in time range)  acetaminophen  (TYLENOL ) tablet 650 mg (has no administration in time range)    Or  acetaminophen  (TYLENOL ) suppository 650 mg (has no administration in time range)  oxyCODONE  (Oxy IR/ROXICODONE ) immediate release tablet 5 mg (has no administration in time range)  HYDROmorphone  (DILAUDID ) injection 0.5-1 mg (has no administration in time range)  traZODone  (DESYREL ) tablet 25 mg (has no administration in time range)  senna-docusate (Senokot-S) tablet 1 tablet (has no administration in time range)  bisacodyl  (DULCOLAX) EC tablet 5 mg (has no administration in time range)  sodium phosphate (FLEET) enema 1 enema (has no administration in time range)  ondansetron  (ZOFRAN ) tablet 4 mg (has no administration in time range)    Or  ondansetron  (ZOFRAN ) injection 4 mg (has no administration in time range)  ipratropium (ATROVENT ) nebulizer solution 0.5 mg (has no administration in time range)  levalbuterol  (XOPENEX ) nebulizer solution 0.63 mg (has  no administration in time range)  hydrALAZINE  (APRESOLINE ) injection 10 mg (has no administration in time range)  furosemide  (LASIX ) injection 40 mg (has no administration in time range)  apixaban  (ELIQUIS ) tablet 2.5 mg (has no administration in time range)  calcium  carbonate (OS-CAL - dosed in mg of elemental calcium ) tablet 1,250 mg (has no administration in time range)  levothyroxine  (SYNTHROID ) tablet 50 mcg (has no administration in time range)  metoprolol  succinate (TOPROL -XL) 24 hr tablet 25 mg (has no administration in time range)  multivitamin with minerals tablet 1 tablet (has no administration in time range)  Vitamin D  (Ergocalciferol ) (DRISDOL ) 1.25 MG (50000 UNIT) capsule 50,000 Units (has no administration in time range)  sodium chloride  0.9 % bolus 500 mL (0 mLs Intravenous Stopped 09/28/23 1355)  metoprolol  tartrate (LOPRESSOR ) injection 5 mg (5 mg Intravenous Given 09/28/23 1425)  furosemide  (LASIX ) injection 20 mg (20 mg Intravenous Given 09/28/23 1425)  Clinical Course as of 09/28/23 1515  Tue Sep 28, 2023  1514 Rate has remained oscillating between low 100s up to the 120s and A-fib after small fluid bolus.  There is bilateral pleural effusions and BNP is elevated.  This is concerning for new onset heart failure.  Will give Lasix  here and IV metoprolol  to see if that helps with rate control.  Patient still feels short of breath at rest.  Placed on 2 L nasal cannula.  Discussed admitting hospitalist to accept patient for admission [MP]    Clinical Course User Index [MP] Pamella Ozell LABOR, DO                                 Medical Decision Making 89 year old female with history as above presented to the ED for ongoing shortness of breath for last few weeks.  Worse with walking short distances.  Has felt short of breath since being hospitalized for pneumonia back in May.  Reports compliance with Eliquis  and metoprolol .  No fevers chills or chest pain.  Well-appearing on my  exam.  She is in A-fib with a rate oscillating between the 110s 120s on my assessment.  Son voiced concern for decreased p.o. intake at home.  Differential diagnosis includes A-fib with RVR, dehydration, new onset heart failure, recurrent pneumonia viral respiratory illness.  Will obtain EKG labs chest x-ray.  Will give her a small bolus of IV fluids given concern for decreased p.o. intake and continue to monitor her heart rate and rhythm on telemetry.  Amount and/or Complexity of Data Reviewed Labs: ordered. Radiology: ordered.  Risk Prescription drug management. Decision regarding hospitalization.        Final diagnoses:  SOB (shortness of breath)  Atrial fibrillation, unspecified type [facility])    ED Discharge Orders     None          Pamella Ozell LABOR, DO 09/28/23 1515

## 2023-09-28 NOTE — Assessment & Plan Note (Addendum)
New diagnosis 1 month ago.  Remains in atrial fibrillation with controlled rate.  Patient has elected against anticoagulation due to history of epistaxis.  CHA2DS2-VASc score is at least 4 suggesting a 4.8% yearly stroke risk.  Risk versus benefits discussed with patient.  Recommended he discuss further with his cardiology team. -Continue Toprol-XL -Continue discussions regarding anticoagulation 

## 2023-09-28 NOTE — Assessment & Plan Note (Deleted)
 Continue Eliquis  for A-fib

## 2023-09-28 NOTE — H&P (Signed)
 History and Physical   Patient: Bethany Villegas                            PCP: Rosamond [name], MD                    DOB: Aug 27, 1934            DOA: 09/28/2023 FMW:[id number]             DOS: 09/28/2023, 3:03 PM  Rosamond [name], MD  Patient coming from:   HOME  I have personally reviewed patient's medical records, in electronic medical records, including:  [facility], and care everywhere.    Chief Complaint:   Chief Complaint  Patient presents with   Shortness of Breath    History of present illness:    Bethany Villegas is a 89 year old active female with a history of paroxysmal A-fib on Eliquis , GERD, Graves' disease, hypothyroidism, vitamin D  deficiency, presented to the ED with chief complaint of progressive shortness of breath. Unproductive cough, generalized weaknesses, poor appetite. Reports shortness of breath has been going on for quite some time has been progressive getting worse now. Was recently seen by PCP for paroxysmal A-fib started on Eliquis , was treated for pneumonia in June.  ED evaluation: Blood pressure (!) 122/98, pulse (!) 103, temperature (!) 97.5 F (36.4 C), resp. rate (!) 25, SpO2 95%.  On 2 L of oxygen  LABs: CBC CMP within normal except exception of creatinine 1.07, troponin of 20, glucose 123, BNP 1839.0 Respiratory panel-all negative Chest x-ray consistent with: Cardiomegaly, small bilateral pleural effusion, basilar opacity    Requested patient to be admitted for new onset CHF, with shortness of breath, pleural effusion.     Patient Denies having: Fever, Chills, Chest Pain, Abd pain, N/V/D, headache, dizziness, lightheadedness,  Dysuria, Joint pain, rash, open wounds   Review of Systems: As per HPI, otherwise 10 point review of systems were negative.   ----------------------------------------------------------------------------------------------------------------------  Allergies  Allergen Reactions   Penicillins Swelling and Rash     Arm swelled   Sulfa Antibiotics Rash    Home MEDs:  Prior to Admission medications   Medication Sig Start Date End Date Taking? Authorizing Provider  apixaban  (ELIQUIS ) 2.5 MG TABS tablet Take 1 tablet (2.5 mg total) by mouth 2 (two) times daily. 07/02/23   Mallipeddi, Vishnu P, MD  calcium  carbonate (OS-CAL) 1250 (500 Ca) MG chewable tablet Chew 1 tablet by mouth daily.    [provider]  levothyroxine  (SYNTHROID ) 50 MCG tablet Take 50 mcg by mouth daily. 08/11/23   [provider]  metoprolol  succinate (TOPROL -XL) 25 MG 24 hr tablet Take 0.5 tablets (12.5 mg total) by mouth daily. 07/02/23   Mallipeddi, Vishnu P, MD  multivitamin-iron-minerals-folic acid (CENTRUM) chewable tablet Chew 1 tablet by mouth daily.    [provider]  Vitamin D , Ergocalciferol , (DRISDOL ) 1.25 MG (50000 UT) CAPS capsule Take 50,000 Units by mouth once a week. 05/08/18   [provider]    PRN MEDs: acetaminophen  **OR** acetaminophen , bisacodyl , hydrALAZINE , HYDROmorphone  (DILAUDID ) injection, ipratropium, levalbuterol , ondansetron  **OR** ondansetron  (ZOFRAN ) IV, oxyCODONE , senna-docusate, sodium phosphate, traZODone   Past Medical History:  Diagnosis Date   A-fib (HCC)    GERD (gastroesophageal reflux disease)    Graves disease    Hypothyroid    Kidney stones     Past Surgical History:  Procedure Laterality Date   ENDOVENOUS ABLATION SAPHENOUS VEIN  W/ LASER Left 11/08/2019   endovenous laser ablation L GSV and stab phlebectomy 10-20 incisions left leg by Carlin Haddock MD    ESOPHAGOGASTRODUODENOSCOPY N/A 08/23/2015   Procedure: ESOPHAGOGASTRODUODENOSCOPY (EGD);  Surgeon: Claudis RAYMOND Rivet, MD;  Location: [facility];  Service: Endoscopy;  Laterality: N/A;  235-moved to 220 Ann to notify pt   TOTAL ABDOMINAL HYSTERECTOMY       reports that she has never smoked. She has never used smokeless tobacco. She reports that she does not drink alcohol  and does not use  drugs.   History reviewed. No pertinent family history.  Physical Exam:   Vitals:   09/28/23 1400 09/28/23 1415 09/28/23 1430 09/28/23 1445  BP: (!) 125/94 (!) 122/98 128/87 115/82  Pulse: 97 (!) 103 (!) 104 (!) 108  Resp: 20 (!) 25 (!) 23 20  Temp:      TempSrc:      SpO2: 97% 95% 95% 92%  Weight:      Height:       Constitutional: NAD, calm, comfortable Eyes: PERRL, lids and conjunctivae normal ENMT: Mucous membranes are moist. Posterior pharynx clear of any exudate or lesions.Normal dentition.  Neck: normal, supple, no masses, no thyromegaly Respiratory: Lower lobe crackles otherwise -clear to auscultation bilaterally, no wheezing,  Normal respiratory effort. No accessory muscle use.  Cardiovascular: Irregularly irregular no extremity edema. 2+ pedal pulses. No carotid bruits.  Abdomen: no tenderness, no masses palpated. No hepatosplenomegaly. Bowel sounds positive.  Musculoskeletal: no clubbing / cyanosis. No joint deformity upper and lower extremities. Good ROM, no contractures. Normal muscle tone.  Neurologic: CN II-XII grossly intact. Sensation intact, DTR normal. Strength 5/5 in all 4.  Psychiatric: Normal judgment and insight. Alert and oriented x 3. Normal mood.  Skin: no rashes, lesions, ulcers. No induration      Labs on admission:    I have personally reviewed following labs and imaging studies  CBC: Recent Labs  Lab 09/28/23 1230  WBC 7.3  NEUTROABS 5.4  HGB 13.5  HCT 41.6  MCV 105.1*  PLT 229   Basic Metabolic Panel: Recent Labs  Lab 09/28/23 1230  NA 138  K 4.1  CL 106  CO2 23  GLUCOSE 123*  BUN 15  CREATININE 1.07*  CALCIUM  8.9   GFR: Estimated Creatinine Clearance: 28 mL/min (A) (by C-G formula based on SCr of 1.07 mg/dL (H)). Liver Function Tests: Recent Labs  Lab 09/28/23 1230  AST 35  ALT 28  ALKPHOS 66  BILITOT 1.9*  PROT 6.8  ALBUMIN 3.8    Urine analysis:    Component Value Date/Time   COLORURINE STRAW (A)  06/13/2018 1322   APPEARANCEUR CLEAR 06/13/2018 1322   LABSPEC 1.006 06/13/2018 1322   PHURINE 7.0 06/13/2018 1322   GLUCOSEU NEGATIVE 06/13/2018 1322   HGBUR NEGATIVE 06/13/2018 1322   BILIRUBINUR NEGATIVE 06/13/2018 1322   KETONESUR 5 (A) 06/13/2018 1322   PROTEINUR NEGATIVE 06/13/2018 1322   NITRITE NEGATIVE 06/13/2018 1322   LEUKOCYTESUR NEGATIVE 06/13/2018 1322    Last A1C:  No results found for: HGBA1C   Radiologic Exams on Admission:   DG Chest Portable 1 View Result Date: 09/28/2023 CLINICAL DATA:  Shortness of breath. EXAM: PORTABLE CHEST 1 VIEW COMPARISON:  None Available. FINDINGS: The heart is enlarged. Mediastinal contours are within normal limits. Aortic atherosclerosis. Blunting of the bilateral costophrenic angles is suggestive of small bilateral pleural effusions. Bibasilar opacities could reflect atelectasis and/or infiltrate. No pneumothorax. No acute osseous abnormality. IMPRESSION: Cardiomegaly with blunting  of the bilateral costophrenic angles, suggestive of small bilateral pleural effusions. Bibasilar opacities could reflect atelectasis and/or infiltrate. Electronically Signed   By: Harrietta Sherry M.D.   On: 09/28/2023 12:54    EKG:   Independently reviewed.  Orders placed or performed during the hospital encounter of 09/28/23   ED EKG   ED EKG   EKG 12-Lead   EKG 12-Lead   EKG 12-Lead   ---------------------------------------------------------------------------------------------------------------------------------------    Assessment / Plan:   Principal Problem:   SOB (shortness of breath) Active Problems:   Congestive heart failure (CHF) (HCC)   Elevated troponin   Paroxysmal atrial fibrillation (HCC)   GERD (gastroesophageal reflux disease)   Graves disease   Current use of long term anticoagulation   Assessment and Plan: * SOB (shortness of breath) - Likely due to new onset CHF -Continue O2 supplement maintaining O2 sat greater  92% -Currently on 2 L satting 95% -As needed DuoNeb bronchodilators -Continuing diuretics   Elevated troponin - Denies any chest pain or shortness of breath -Likely ischemic demand due to A-fib, acute Rester distress -Per EDP no acute changes on EKG -Repeat EKG as needed -Ruling out ACS -Recycle cardiac enzyme -As needed aspirin, nitroglycerin, as needed analgesics  Congestive heart failure (CHF) (HCC) Likely new onset congestive heart failure: Clinically defined by shortness of breath, pulmonary effusion, congestion, elevated BNP - BNP 1,839.0 -Chest x-ray consistent pulmonary congestion, bilateral pleural effusion - Last echo February 2025: LVEF 50 to 55%, mild diastolic dysfunction, mild MR, aortic root dilatation 3.8 cm.   - Due to worsening symptoms-repeating echocardiogram  -Initiating IV diuretics Lasix  40 mg twice daily - Monitoring I's and O's, daily weight -Following labs, BMP, Reds Clip  - Consulting cardiology for further evaluation and recommendation -   Paroxysmal atrial fibrillation (HCC) - Tachycardic, Continue Eliquis  and metoprolol  -Increasing her metoprolol  from 12.5 to 25 mg p.o. daily  Current use of long term anticoagulation Continue Eliquis  for A-fib  Graves disease Stable checking TSH  GERD (gastroesophageal reflux disease) Continue PPI      Consults called: Cardiology -------------------------------------------------------------------------------------------------------------------------------------------- DVT prophylaxis:  apixaban  (ELIQUIS ) tablet 2.5 mg Start: 09/28/23 1445 SCDs Start: 09/28/23 1439 apixaban  (ELIQUIS ) tablet 2.5 mg   Code Status:   Code Status: Full Code   Admission status: Patient will be admitted as Inpatient, with a greater than 2 midnight length of stay. Level of care: Telemetry   Family Communication:  none at bedside  (The above findings and plan of care has been discussed with patient in detail, the  patient expressed understanding and agreement of above plan)  --------------------------------------------------------------------------------------------------------------------------------------------------  Disposition Plan:  Anticipated 1-2 days Status is: Inpatient Remains inpatient appropriate because: Needing IV diuretics, cardiac evaluation and imaging  --------------------------------------------------------------------------------------------------------------  Time spent:  39  Min.  Was spent seeing and evaluating the patient, reviewing all medical records, drawn plan of care.  SIGNED: Adriana DELENA Grams, MD, FHM. FAAFP. [facility], Pager  (Please use [email or web address] to page/ or secure chat through epic) If 7PM-7AM, please contact night-coverage [email or web address],  09/28/2023, 3:03 PM

## 2023-09-28 NOTE — Assessment & Plan Note (Signed)
 Continue PPI.

## 2023-09-28 NOTE — ED Triage Notes (Signed)
 Pt comes in for SOB, has been going on for months  - pt went to PCP with recent change of meds. Including adding eliquis   - New dx with a-fib - dx with PNA in 06/2023  - s/s: unproductive cough, weakness, lack of app.    - RA @ baseline - A&Ox4

## 2023-09-28 NOTE — Assessment & Plan Note (Addendum)
 TSH 11,69 and free T4 1,35 with normal T3.   Follow thyroid  function test as outpatient

## 2023-09-28 NOTE — Assessment & Plan Note (Deleted)
 Likely new onset congestive heart failure: Clinically defined by shortness of breath, pulmonary effusion, congestion, elevated BNP - BNP 1,839.0 -Chest x-ray consistent pulmonary congestion, bilateral pleural effusion - Last echo February 2025: LVEF 50 to 55%, mild diastolic dysfunction, mild MR, aortic root dilatation 3.8 cm.   - Due to worsening symptoms-repeating echocardiogram  -Initiating IV diuretics Lasix  40 mg twice daily - Monitoring I's and O's, daily weight -Following labs, BMP, Reds Clip  - Consulting cardiology for further evaluation and recommendation -

## 2023-09-28 NOTE — Assessment & Plan Note (Deleted)
-   Likely due to new onset CHF -Continue O2 supplement maintaining O2 sat greater 92% -Currently on 2 L satting 95% -As needed DuoNeb bronchodilators -Continuing diuretics

## 2023-09-28 NOTE — Assessment & Plan Note (Deleted)
-   Denies any chest pain or shortness of breath -Likely ischemic demand due to A-fib, acute Rester distress -Per EDP no acute changes on EKG -Repeat EKG as needed -Ruling out ACS -Recycle cardiac enzyme -As needed aspirin, nitroglycerin, as needed analgesics

## 2023-09-29 ENCOUNTER — Inpatient Hospital Stay (HOSPITAL_COMMUNITY)

## 2023-09-29 DIAGNOSIS — R0602 Shortness of breath: Secondary | ICD-10-CM | POA: Diagnosis not present

## 2023-09-29 DIAGNOSIS — I5021 Acute systolic (congestive) heart failure: Secondary | ICD-10-CM | POA: Diagnosis not present

## 2023-09-29 LAB — CBC
HCT: 40.3 % (ref 36.0–46.0)
Hemoglobin: 13.7 g/dL (ref 12.0–15.0)
MCH: 34.7 pg — ABNORMAL HIGH (ref 26.0–34.0)
MCHC: 34 g/dL (ref 30.0–36.0)
MCV: 102 fL — ABNORMAL HIGH (ref 80.0–100.0)
Platelets: 234 K/uL (ref 150–400)
RBC: 3.95 MIL/uL (ref 3.87–5.11)
RDW: 13.3 % (ref 11.5–15.5)
WBC: 5.8 K/uL (ref 4.0–10.5)
nRBC: 0 % (ref 0.0–0.2)

## 2023-09-29 LAB — BRAIN NATRIURETIC PEPTIDE: B Natriuretic Peptide: 2406 pg/mL — ABNORMAL HIGH (ref 0.0–100.0)

## 2023-09-29 LAB — ECHOCARDIOGRAM COMPLETE
Height: 62.5 in
MV M vel: 4.54 m/s
MV Peak grad: 82.4 mmHg
P 1/2 time: 1373 ms
S' Lateral: 2.4 cm
Weight: 1710.77 [oz_av]

## 2023-09-29 LAB — BASIC METABOLIC PANEL WITH GFR
Anion gap: 10 (ref 5–15)
BUN: 16 mg/dL (ref 8–23)
CO2: 27 mmol/L (ref 22–32)
Calcium: 8.7 mg/dL — ABNORMAL LOW (ref 8.9–10.3)
Chloride: 102 mmol/L (ref 98–111)
Creatinine, Ser: 1.04 mg/dL — ABNORMAL HIGH (ref 0.44–1.00)
GFR, Estimated: 52 mL/min — ABNORMAL LOW (ref >60)
Glucose, Bld: 93 mg/dL (ref 70–99)
Potassium: 3.2 mmol/L — ABNORMAL LOW (ref 3.5–5.1)
Sodium: 139 mmol/L (ref 135–145)

## 2023-09-29 LAB — GLUCOSE, CAPILLARY: Glucose-Capillary: 88 mg/dL (ref 70–99)

## 2023-09-29 LAB — T3, FREE: T3, Free: 2.1 pg/mL (ref 2.0–4.4)

## 2023-09-29 MED ORDER — POTASSIUM CHLORIDE CRYS ER 20 MEQ PO TBCR
40.0000 meq | EXTENDED_RELEASE_TABLET | ORAL | Status: AC
  Administered 2023-09-29 (×4): 40 meq via ORAL
  Filled 2023-09-29 (×2): qty 2

## 2023-09-29 NOTE — Progress Notes (Signed)
   09/29/23 1000  ReDS Vest / Clip  Station Marker A  Ruler Value 31  ReDS Value Range < 36  ReDS Actual Value 34

## 2023-09-29 NOTE — Progress Notes (Signed)
  Echocardiogram 2D Echocardiogram has been performed.  Koleen KANDICE Popper, RDCS 09/29/2023, 9:59 AM

## 2023-09-29 NOTE — Progress Notes (Signed)
   09/29/23 1122  TOC Brief Assessment  Insurance and Status Reviewed  Patient has primary care physician Yes  Home environment has been reviewed Home alone  Prior level of function: Independent  Prior/Current Home Services No current home services  Social Drivers of Health Review SDOH reviewed no interventions necessary  Readmission risk has been reviewed Yes  Transition of care needs no transition of care needs at this time   Patient admitted with SOB.  Patient lives at home alone and has family support. Independent with ADL's and mobility. Pt still drives and works at [employer]. No issues with follow up appointments. Pt has scale at home and takes her medications consistently. CHF education added to AVS.

## 2023-09-29 NOTE — Progress Notes (Signed)
 PROGRESS NOTE  Bethany Villegas, is a 89 y.o. female, DOB - Jul 17, 1934, FMW:[id number]  Admit date - 09/28/2023   Admitting Physician Bethany DELENA Grams, MD  Outpatient Primary MD for the patient is Bethany [name], MD  LOS - 1  Chief Complaint  Patient presents with   Shortness of Breath        Brief Narrative:  89 year old active female with a history of paroxysmal A-fib on Eliquis , GERD, Graves' disease, hypothyroidism, vitamin D  deficiency admitted on 09/28/2022 with acute on chronic systolic dysfunction CHF    -Assessment and Plan: 1)Acute systolic dysfunction CHF -No prior history of CHF -  BNP 1,839.0, chest x-ray on admission consistent with CHF Echo from 09/29/2022 with EF of 40 to 45%, global hypokinesis, moderate LVH,, No pulmonary hypertension, patient with moderate MR < no aortic stenosis - Continue IV Lasix --voiding well - Soft BP limiting his ability to add GDMT  - Diuresing well, weaning off oxygen - Continue daily weights, strict I's and O's and ReDs vest  2)Elevated troponin - Denies any chest pain or shortness of breath -Likely ischemic demand due to A-fib, CHF, hypoxia with acute  hypoxic respiratory failure --Troponin 20 >>20 >>22>>22 - EKG with A-fib with RVR, no ACS type changes - No aspirin due to need for Eliquis   3)PAFIb with RVR--- continue metoprolol  for rate control - Continue age and weight adjusted dose of Eliquis  for stroke prophylaxis  4) acute hypoxic respiratory failure--due to #1 above -Anticipate being able to wean off O2 with diuresis - 5)Hypokalemia--replace and recheck especially while diuresing   6)Hypothyroidism--TSH is 11.7 - Increase levothyroxine  to 62.5 mcg from 50 mcg  Status is: Inpatient   Disposition: The patient is from: Home              Anticipated d/c is to: Home              Anticipated d/c date is: 2 days              Patient currently is not medically stable to d/c. Barriers: Not Clinically Stable-   Code Status :   -  Code Status: Full Code   Family Communication:    (patient is alert, awake and coherent)   Discussed with son at bedside   DVT Prophylaxis  :   - SCDs  apixaban  (ELIQUIS ) tablet 2.5 mg Start: 09/28/23 1445 SCDs Start: 09/28/23 1439 apixaban  (ELIQUIS ) tablet 2.5 mg   Lab Results  Component Value Date   PLT 234 09/29/2023    Inpatient Medications  Scheduled Meds:  apixaban   2.5 mg Oral BID   calcium  carbonate  1,250 mg Oral Daily   furosemide   40 mg Intravenous Q12H   levothyroxine   50 mcg Oral Daily   metoprolol  succinate  25 mg Oral Daily   multivitamin with minerals  1 tablet Oral Daily   pneumococcal 20-valent conjugate vaccine  0.5 mL Intramuscular Tomorrow-1000   sodium chloride  flush  3 mL Intravenous Q12H   sodium chloride  flush  3 mL Intravenous Q12H   vitamin B-12  100 mcg Oral Daily   Vitamin D  (Ergocalciferol )  50,000 Units Oral Weekly   Continuous Infusions: PRN Meds:.acetaminophen  **OR** acetaminophen , bisacodyl , hydrALAZINE , HYDROmorphone  (DILAUDID ) injection, ipratropium, levalbuterol , ondansetron  **OR** ondansetron  (ZOFRAN ) IV, oxyCODONE , senna-docusate, sodium phosphate, traZODone    Anti-infectives (From admission, onward)    None         Subjective: Bethany Villegas today has no fevers, no emesis,  No chest pain,    Son  Bethany Villegas at bedside, questions answered  Objective: Vitals:   09/28/23 1552 09/28/23 1958 09/28/23 2328 09/29/23 0412  BP: (!) 127/100 109/78 128/83 114/69  Pulse: (!) 103  98 90  Resp: 20 20 18 18   Temp: 97.9 F (36.6 C) (!) 97.5 F (36.4 C) 97.8 F (36.6 C) 98 F (36.7 C)  TempSrc: Oral Oral Oral Oral  SpO2: 99% 98% 97% 98%  Weight:    48.5 kg  Height:        Intake/Output Summary (Last 24 hours) at 09/29/2023 1927 Last data filed at 09/29/2023 1700 Gross per 24 hour  Intake 1140 ml  Output 2200 ml  Net -1060 ml   Filed Weights   09/28/23 1203 09/28/23 1538 09/29/23 0412  Weight: 52.2 kg 52.6 kg 48.5 kg     Physical Exam  Gen:- Awake Alert,  in no apparent distress  HEENT:- [place].AT, No sclera icterus Nose- [place] 2L/min Neck-Supple Neck,No JVD,.  Lungs-diminished breath sounds, faint bibasilar rales  CV- S1, S2 normal, irregularly irregular Abd-  +ve B.Sounds, Abd Soft, No tenderness,    Extremity/Skin:- No  edema, pedal pulses present  Psych-affect is appropriate, oriented x3 Neuro-no new focal deficits, no tremors  Data Reviewed: I have personally reviewed following labs and imaging studies  CBC: Recent Labs  Lab 09/28/23 1230 09/29/23 0506  WBC 7.3 5.8  NEUTROABS 5.4  --   HGB 13.5 13.7  HCT 41.6 40.3  MCV 105.1* 102.0*  PLT 229 234   Basic Metabolic Panel: Recent Labs  Lab 09/28/23 1230 09/29/23 0506  NA 138 139  K 4.1 3.2*  CL 106 102  CO2 23 27  GLUCOSE 123* 93  BUN 15 16  CREATININE 1.07* 1.04*  CALCIUM  8.9 8.7*  MG 2.2  --   PHOS 3.7  --    GFR: Estimated Creatinine Clearance: 29.2 mL/min (A) (by C-G formula based on SCr of 1.04 mg/dL (H)). Liver Function Tests: Recent Labs  Lab 09/28/23 1230  AST 35  ALT 28  ALKPHOS 66  BILITOT 1.9*  PROT 6.8  ALBUMIN 3.8   Recent Results (from the past 240 hours)  Resp panel by RT-PCR (RSV, Flu A&B, Covid) Anterior Nasal Swab     Status: None   Collection Time: 09/28/23  1:08 PM   Specimen: Anterior Nasal Swab  Result Value Ref Range Status   SARS Coronavirus 2 by RT PCR NEGATIVE NEGATIVE Final    Comment: (NOTE) SARS-CoV-2 target nucleic acids are NOT DETECTED.  The SARS-CoV-2 RNA is generally detectable in upper respiratory specimens during the acute phase of infection. The lowest concentration of SARS-CoV-2 viral copies this assay can detect is 138 copies/mL. A negative result does not preclude SARS-Cov-2 infection and should not be used as the sole basis for treatment or other patient management decisions. A negative result may occur with  improper specimen collection/handling, submission of specimen  other than nasopharyngeal swab, presence of viral mutation(s) within the areas targeted by this assay, and inadequate number of viral copies(<138 copies/mL). A negative result must be combined with clinical observations, patient history, and epidemiological information. The expected result is Negative.  Fact Sheet for Patients:  BloggerCourse.com  Fact Sheet for Healthcare Providers:  SeriousBroker.it  This test is no t yet approved or cleared by the [place]  FDA and  has been authorized for detection and/or diagnosis of SARS-CoV-2 by FDA under an Emergency Use Authorization (EUA). This EUA will remain  in effect (meaning this test can be used) for  the duration of the COVID-19 declaration under Section 564(b)(1) of the Act, 21 U.S.C.section 360bbb-3(b)(1), unless the authorization is terminated  or revoked sooner.       Influenza A by PCR NEGATIVE NEGATIVE Final   Influenza B by PCR NEGATIVE NEGATIVE Final    Comment: (NOTE) The Xpert Xpress SARS-CoV-2/FLU/RSV plus assay is intended as an aid in the diagnosis of influenza from Nasopharyngeal swab specimens and should not be used as a sole basis for treatment. Nasal washings and aspirates are unacceptable for Xpert Xpress SARS-CoV-2/FLU/RSV testing.  Fact Sheet for Patients: BloggerCourse.com  Fact Sheet for Healthcare Providers: SeriousBroker.it  This test is not yet approved or cleared by the [place]  FDA and has been authorized for detection and/or diagnosis of SARS-CoV-2 by FDA under an Emergency Use Authorization (EUA). This EUA will remain in effect (meaning this test can be used) for the duration of the COVID-19 declaration under Section 564(b)(1) of the Act, 21 U.S.C. section 360bbb-3(b)(1), unless the authorization is terminated or revoked.     Resp Syncytial Virus by PCR NEGATIVE NEGATIVE Final     Comment: (NOTE) Fact Sheet for Patients: BloggerCourse.com  Fact Sheet for Healthcare Providers: SeriousBroker.it  This test is not yet approved or cleared by the [place]  FDA and has been authorized for detection and/or diagnosis of SARS-CoV-2 by FDA under an Emergency Use Authorization (EUA). This EUA will remain in effect (meaning this test can be used) for the duration of the COVID-19 declaration under Section 564(b)(1) of the Act, 21 U.S.C. section 360bbb-3(b)(1), unless the authorization is terminated or revoked.  Performed at [facility], [address], [place], [place] [address]     Radiology Studies: ECHOCARDIOGRAM COMPLETE Result Date: 09/29/2023    ECHOCARDIOGRAM REPORT   Patient Name:   MILA PAIR Date of Exam: 09/29/2023 Medical Rec #:  [id number]        Height:       62.5 in Accession #:    [id number]       Weight:       106.9 lb Date of Birth:  [birth year]        BSA:          1.474 m Patient Age:    89 years         BP:           114/69 mmHg Patient Gender: F                HR:           93 bpm. Exam Location:  Inpatient Procedure: 2D Echo, Color Doppler and Cardiac Doppler (Both Spectral and Color            Flow Doppler were utilized during procedure). Indications:    CHF- Acute Systolic I50.21  History:        Patient has no prior history of Echocardiogram examinations.                 CHF, Arrythmias:Atrial Fibrillation; Signs/Symptoms:Shortness of                 Breath and Dyspnea.  Sonographer:    Koleen Popper RDCS Referring Phys: [phone] SEYED A SHAHMEHDI IMPRESSIONS  1. Left ventricular ejection fraction, by estimation, is 40 to 45%. The left ventricle has mildly decreased function. The left ventricle demonstrates global hypokinesis. There is moderate concentric left ventricular hypertrophy. Left ventricular diastolic parameters are indeterminate.  2. Right ventricular systolic function is mildly reduced. The  right ventricular size is normal. There is normal pulmonary artery systolic pressure. The estimated right ventricular systolic pressure is 29.7 mmHg.  3. Left pleural effusion noted.  4. The mitral valve is grossly normal. Moderate mitral valve regurgitation.  5. Tricuspid valve regurgitation is moderate.  6. The aortic valve is tricuspid. There is mild calcification of the aortic valve. Aortic valve regurgitation is trivial. Aortic valve sclerosis is present, with no evidence of aortic valve stenosis.  7. The inferior vena cava is normal in size with <50% respiratory variability, suggesting right atrial pressure of 8 mmHg. Comparison(s): Prior images unable to be directly viewed. FINDINGS  Left Ventricle: Left ventricular ejection fraction, by estimation, is 40 to 45%. The left ventricle has mildly decreased function. The left ventricle demonstrates global hypokinesis. The left ventricular internal cavity size was normal in size. There is  moderate concentric left ventricular hypertrophy. Left ventricular diastolic function could not be evaluated due to atrial fibrillation. Left ventricular diastolic parameters are indeterminate. Right Ventricle: The right ventricular size is normal. No increase in right ventricular wall thickness. Right ventricular systolic function is mildly reduced. There is normal pulmonary artery systolic pressure. The tricuspid regurgitant velocity is 2.33 m/s, and with an assumed right atrial pressure of 8 mmHg, the estimated right ventricular systolic pressure is 29.7 mmHg. Left Atrium: Left atrial size was normal in size. Right Atrium: Right atrial size was normal in size. Pericardium: Left pleural effusion noted. There is no evidence of pericardial effusion. Mitral Valve: The mitral valve is grossly normal. Moderate mitral valve regurgitation, with posteriorly-directed jet. Tricuspid Valve: The tricuspid valve is grossly normal. Tricuspid valve regurgitation is moderate. Aortic Valve: The  aortic valve is tricuspid. There is mild calcification of the aortic valve. Aortic valve regurgitation is trivial. Aortic regurgitation PHT measures 1373 msec. Aortic valve sclerosis is present, with no evidence of aortic valve stenosis. Pulmonic Valve: The pulmonic valve was grossly normal. Pulmonic valve regurgitation is trivial. Aorta: The aortic root and ascending aorta are structurally normal, with no evidence of dilitation. Venous: The inferior vena cava is normal in size with less than 50% respiratory variability, suggesting right atrial pressure of 8 mmHg. IAS/Shunts: No atrial level shunt detected by color flow Doppler. Additional Comments: 3D was performed not requiring image post processing on an independent workstation and was indeterminate.  LEFT VENTRICLE PLAX 2D LVIDd:         3.00 cm LVIDs:         2.40 cm LV PW:         1.60 cm LV IVS:        1.40 cm LVOT diam:     1.60 cm LV SV:         25 LV SV Index:   17 LVOT Area:     2.01 cm  RIGHT VENTRICLE             IVC RV Basal diam:  2.80 cm     IVC diam: 1.70 cm RV S prime:     10.40 cm/s TAPSE (M-mode): 1.1 cm LEFT ATRIUM             Index        RIGHT ATRIUM           Index LA diam:        3.70 cm 2.51 cm/m   RA Area:     12.60 cm LA Vol (A2C):   55.1 ml 37.39 ml/m  RA Volume:   22.70 ml  15.40 ml/m LA Vol (A4C):   43.7 ml 29.65 ml/m LA Biplane Vol: 49.7 ml 33.72 ml/m  AORTIC VALVE LVOT Vmax:   77.00 cm/s LVOT Vmean:  48.200 cm/s LVOT VTI:    0.123 m AI PHT:      1373 msec  AORTA Ao Root diam: 3.70 cm Ao Asc diam:  3.20 cm MR Peak grad: 82.4 mmHg   TRICUSPID VALVE MR Vmax:      454.00 cm/s TR Peak grad:   21.7 mmHg                           TR Vmax:        233.00 cm/s                            SHUNTS                           Systemic VTI:  0.12 m                           Systemic Diam: 1.60 cm Jayson Sierras MD Electronically signed by Jayson Sierras MD Signature Date/Time: 09/29/2023/10:14:18 AM    Final    DG Chest Portable 1  View Result Date: 09/28/2023 CLINICAL DATA:  Shortness of breath. EXAM: PORTABLE CHEST 1 VIEW COMPARISON:  None Available. FINDINGS: The heart is enlarged. Mediastinal contours are within normal limits. Aortic atherosclerosis. Blunting of the bilateral costophrenic angles is suggestive of small bilateral pleural effusions. Bibasilar opacities could reflect atelectasis and/or infiltrate. No pneumothorax. No acute osseous abnormality. IMPRESSION: Cardiomegaly with blunting of the bilateral costophrenic angles, suggestive of small bilateral pleural effusions. Bibasilar opacities could reflect atelectasis and/or infiltrate. Electronically Signed   By: Harrietta Sherry M.D.   On: 09/28/2023 12:54   Scheduled Meds:  apixaban   2.5 mg Oral BID   calcium  carbonate  1,250 mg Oral Daily   furosemide   40 mg Intravenous Q12H   levothyroxine   50 mcg Oral Daily   metoprolol  succinate  25 mg Oral Daily   multivitamin with minerals  1 tablet Oral Daily   pneumococcal 20-valent conjugate vaccine  0.5 mL Intramuscular Tomorrow-1000   sodium chloride  flush  3 mL Intravenous Q12H   sodium chloride  flush  3 mL Intravenous Q12H   vitamin B-12  100 mcg Oral Daily   Vitamin D  (Ergocalciferol )  50,000 Units Oral Weekly   Continuous Infusions:   LOS: 1 day    Rendall Carwin M.D on 09/29/2023 at 7:27 PM  Go to [email or web address] - for contact info  [facility] - Office  [phone]  If 7PM-7AM, please contact night-coverage [email or web address] 09/29/2023, 7:27 PM

## 2023-09-29 NOTE — Evaluation (Signed)
 Physical Therapy Evaluation Patient Details Name: Bethany Villegas MRN: [id number] DOB: Feb 04, 1935 Today's Date: 09/29/2023  History of Present Illness  Bethany Villegas is a 89 year old active female with a history of paroxysmal A-fib on Eliquis , GERD, Graves' disease, hypothyroidism, vitamin D  deficiency, presented to the ED with chief complaint of progressive shortness of breath.  Unproductive cough, generalized weaknesses, poor appetite.  Reports shortness of breath has been going on for quite some time has been progressive getting worse now.  Was recently seen by PCP for paroxysmal A-fib started on Eliquis , was treated for pneumonia in June.   Clinical Impression  Patient functioning near baseline for functional mobility and gait demonstrating independence with bed mobility and transfers. Patient able to ambulate 122ft without AD demonstrating some mild balance deficits frequently leaning on wall railing for additional stability. Patient SpO2 maintained ~93% with exertion though some subjective c/o SOB at the end of ambulation that did not affect quality of gait. Patient discharged to care of nursing for ambulation daily as tolerated for length of stay.         If plan is discharge home, recommend the following:     Can travel by private vehicle        Equipment Recommendations None recommended by PT  Recommendations for Other Services       Functional Status Assessment Patient has not had a recent decline in their functional status     Precautions / Restrictions Precautions Precautions: Fall Recall of Precautions/Restrictions: Intact Restrictions Weight Bearing Restrictions Per Provider Order: No      Mobility  Bed Mobility Overal bed mobility: Independent                  Transfers Overall transfer level: Independent Equipment used: None                    Ambulation/Gait Ambulation/Gait assistance: Supervision Gait Distance (Feet): 100  Feet Assistive device: None Gait Pattern/deviations: Decreased step length - right, Decreased step length - left, Decreased stride length, Step-through pattern Gait velocity: WFL     General Gait Details: Slightly unsteady, frequent tendency to lean on wall railing, overall fair return for ambluating in room/hallway without LOB  Stairs            Wheelchair Mobility     Tilt Bed    Modified Rankin (Stroke Patients Only)       Balance Overall balance assessment: Mild deficits observed, not formally tested                                           Pertinent Vitals/Pain Pain Assessment Pain Assessment: No/denies pain    Home Living Family/patient expects to be discharged to:: Private residence Living Arrangements: Alone Available Help at Discharge: Family;Available PRN/intermittently Type of Home: House Home Access: Stairs to enter Entrance Stairs-Rails: None Entrance Stairs-Number of Steps: 2   Home Layout: One level        Prior Function Prior Level of Function : Independent/Modified Independent             Mobility Comments: Drives; works at [employer]; independent ambulator ADLs Comments: Independent     Extremity/Trunk Assessment   Upper Extremity Assessment Upper Extremity Assessment: Defer to OT evaluation    Lower Extremity Assessment Lower Extremity Assessment: Generalized weakness    Cervical / Trunk Assessment Cervical / Trunk Assessment: Normal  Communication   Communication Communication: No apparent difficulties    Cognition Arousal: Alert Behavior During Therapy: WFL for tasks assessed/performed   PT - Cognitive impairments: No apparent impairments                         Following commands: Intact       Cueing Cueing Techniques: Verbal cues, Tactile cues     General Comments      Exercises     Assessment/Plan    PT Assessment Patient does not need any further PT services  PT Problem  List         PT Treatment Interventions      PT Goals (Current goals can be found in the Care Plan section)  Acute Rehab PT Goals Patient Stated Goal: return home PT Goal Formulation: With patient Time For Goal Achievement: 10/13/23 Potential to Achieve Goals: Good    Frequency       Co-evaluation PT/OT/SLP Co-Evaluation/Treatment: Yes Reason for Co-Treatment: To address functional/ADL transfers PT goals addressed during session: Mobility/safety with mobility;Balance;Proper use of DME;Strengthening/ROM OT goals addressed during session: ADL's and self-care       AM-PAC PT 6 Clicks Mobility  Outcome Measure Help needed turning from your back to your side while in a flat bed without using bedrails?: None Help needed moving from lying on your back to sitting on the side of a flat bed without using bedrails?: None Help needed moving to and from a bed to a chair (including a wheelchair)?: None Help needed standing up from a chair using your arms (e.g., wheelchair or bedside chair)?: None Help needed to walk in hospital room?: None Help needed climbing 3-5 steps with a railing? : A Little 6 Click Score: 23    End of Session Equipment Utilized During Treatment: Gait belt Activity Tolerance: Patient tolerated treatment well Patient left: in chair;with family/visitor present;with call bell/phone within reach Nurse Communication: Mobility status PT Visit Diagnosis: Unsteadiness on feet (R26.81);Other abnormalities of gait and mobility (R26.89);Muscle weakness (generalized) (M62.81)    Time: 9249-9185 PT Time Calculation (min) (ACUTE ONLY): 24 min   Charges:   PT Evaluation $PT Eval Moderate Complexity: 1 Mod PT Treatments $Therapeutic Activity: 23-37 mins PT General Charges $$ ACUTE PT VISIT: 1 Visit         11:58 AM, 09/29/23,  Onnie Como, SPT

## 2023-09-29 NOTE — Evaluation (Signed)
 Occupational Therapy Evaluation Patient Details Name: Bethany Villegas MRN: [id number] DOB: 05/29/1934 Today's Date: 09/29/2023   History of Present Illness   Bethany Villegas is a 89 year old active female with a history of paroxysmal A-fib on Eliquis , GERD, Graves' disease, hypothyroidism, vitamin D  deficiency, presented to the ED with chief complaint of progressive shortness of breath.  Unproductive cough, generalized weaknesses, poor appetite.  Reports shortness of breath has been going on for quite some time has been progressive getting worse now.  Was recently seen by PCP for paroxysmal A-fib started on Eliquis , was treated for pneumonia in June. (per MD)     Clinical Impressions Pt agreeable to OT and PT co-evaluation. Pt appears to be near baseline function. Pt able to ambulate in the hall without supplemental O2 and saturation above 90% consistently. Pt demonstrates independence with ADL's and WFL B UE strength and A/ROM. Pt left in the chair with call bell within reach. Pt is not recommended for further acute OT services and will be discharged to care of nursing staff for remaining length of stay.              Functional Status Assessment   Patient has not had a recent decline in their functional status     Equipment Recommendations   None recommended by OT             Precautions/Restrictions   Precautions Precautions: Fall Recall of Precautions/Restrictions: Intact Restrictions Weight Bearing Restrictions Per Provider Order: No     Mobility Bed Mobility Overal bed mobility: Independent                  Transfers Overall transfer level: Independent                        Balance Overall balance assessment: Mild deficits observed, not formally tested                                         ADL either performed or assessed with clinical judgement   ADL Overall ADL's : Independent                                              Vision Baseline Vision/History: 1 Wears glasses Ability to See in Adequate Light: 0 Adequate Patient Visual Report: No change from baseline Vision Assessment?: Wears glasses for reading     Perception Perception: Not tested       Praxis Praxis: Not tested       Pertinent Vitals/Pain Pain Assessment Pain Assessment: No/denies pain     Extremity/Trunk Assessment Upper Extremity Assessment Upper Extremity Assessment: Overall WFL for tasks assessed   Lower Extremity Assessment Lower Extremity Assessment: Defer to PT evaluation   Cervical / Trunk Assessment Cervical / Trunk Assessment: Normal   Communication Communication Communication: No apparent difficulties   Cognition Arousal: Alert Behavior During Therapy: WFL for tasks assessed/performed Cognition: No apparent impairments                               Following commands: Intact       Cueing  General Comments   Cueing Techniques: Verbal cues  Home Living Family/patient expects to be discharged to:: Private residence Living Arrangements: Alone Available Help at Discharge: Family;Available PRN/intermittently Type of Home: House Home Access: Stairs to enter [employer] of Steps: 2 Entrance Stairs-Rails: None Home Layout: One level     Bathroom Shower/Tub: Chief Strategy Officer: Standard (low toilet) Bathroom Accessibility: No              Prior Functioning/Environment Prior Level of Function : Independent/Modified Independent             Mobility Comments: Drives; works at [employer]; independent ambulator ADLs Comments: Independent                            Co-evaluation PT/OT/SLP Co-Evaluation/Treatment: Yes Reason for Co-Treatment: To address functional/ADL transfers   OT goals addressed during session: ADL's and self-care                       End of Session Equipment  Utilized During Treatment: Gait belt  Activity Tolerance: Patient tolerated treatment well Patient left: in chair;with call bell/phone within reach  OT Visit Diagnosis: Unsteadiness on feet (R26.81);Muscle weakness (generalized) (M62.81)                Time: 9197-9183 OT Time Calculation (min): 14 min Charges:  OT General Charges $OT Visit: 1 Visit OT Evaluation $OT Eval Low Complexity: 1 Low  Bethany Villegas OT, MOT   Bethany Villegas 09/29/2023, 9:07 AM

## 2023-09-30 DIAGNOSIS — R0602 Shortness of breath: Secondary | ICD-10-CM | POA: Diagnosis not present

## 2023-09-30 LAB — CBC
HCT: 40.7 % (ref 36.0–46.0)
Hemoglobin: 14 g/dL (ref 12.0–15.0)
MCH: 34.6 pg — ABNORMAL HIGH (ref 26.0–34.0)
MCHC: 34.4 g/dL (ref 30.0–36.0)
MCV: 100.5 fL — ABNORMAL HIGH (ref 80.0–100.0)
Platelets: 251 K/uL (ref 150–400)
RBC: 4.05 MIL/uL (ref 3.87–5.11)
RDW: 13.2 % (ref 11.5–15.5)
WBC: 7.6 K/uL (ref 4.0–10.5)
nRBC: 0 % (ref 0.0–0.2)

## 2023-09-30 LAB — BASIC METABOLIC PANEL WITH GFR
Anion gap: 10 (ref 5–15)
BUN: 18 mg/dL (ref 8–23)
CO2: 26 mmol/L (ref 22–32)
Calcium: 8.9 mg/dL (ref 8.9–10.3)
Chloride: 102 mmol/L (ref 98–111)
Creatinine, Ser: 1.03 mg/dL — ABNORMAL HIGH (ref 0.44–1.00)
GFR, Estimated: 53 mL/min — ABNORMAL LOW (ref >60)
Glucose, Bld: 84 mg/dL (ref 70–99)
Potassium: 4 mmol/L (ref 3.5–5.1)
Sodium: 138 mmol/L (ref 135–145)

## 2023-09-30 LAB — BRAIN NATRIURETIC PEPTIDE: B Natriuretic Peptide: 1169 pg/mL — ABNORMAL HIGH (ref 0.0–100.0)

## 2023-09-30 LAB — GLUCOSE, CAPILLARY: Glucose-Capillary: 89 mg/dL (ref 70–99)

## 2023-09-30 MED ORDER — SODIUM CHLORIDE 0.9 % IV BOLUS
250.0000 mL | Freq: Once | INTRAVENOUS | Status: AC
  Administered 2023-09-30: 250 mL via INTRAVENOUS

## 2023-09-30 MED ORDER — LEVOTHYROXINE SODIUM 125 MCG PO TABS
62.5000 ug | ORAL_TABLET | Freq: Every day | ORAL | Status: DC
  Administered 2023-10-01 – 2023-10-02 (×2): 62.5 ug via ORAL
  Filled 2023-09-30 (×2): qty 1

## 2023-09-30 MED ORDER — METOPROLOL SUCCINATE ER 25 MG PO TB24
25.0000 mg | ORAL_TABLET | Freq: Once | ORAL | Status: AC
  Administered 2023-09-30: 25 mg via ORAL
  Filled 2023-09-30: qty 1

## 2023-09-30 MED ORDER — METOPROLOL SUCCINATE ER 50 MG PO TB24
50.0000 mg | ORAL_TABLET | Freq: Every day | ORAL | Status: DC
  Administered 2023-10-01 – 2023-10-02 (×2): 50 mg via ORAL
  Filled 2023-09-30 (×2): qty 1

## 2023-09-30 MED ORDER — FUROSEMIDE 10 MG/ML IJ SOLN
20.0000 mg | Freq: Two times a day (BID) | INTRAMUSCULAR | Status: DC
  Administered 2023-10-01: 20 mg via INTRAVENOUS
  Filled 2023-09-30: qty 2

## 2023-09-30 MED ORDER — MIDODRINE HCL 5 MG PO TABS
10.0000 mg | ORAL_TABLET | Freq: Once | ORAL | Status: AC
  Administered 2023-09-30: 10 mg via ORAL
  Filled 2023-09-30: qty 2

## 2023-09-30 NOTE — Progress Notes (Signed)
 Heart Failure Navigator Progress Note  Attempted call to patient room 318 at [facility] and spoke with her son Lyndy as she was sleeping.  Will call back later on today when she is up to review Heart Failure Education and set up her AHF TOC appointment. Request in for the assigned nurse to deliver a Living Better with Heart Failure folder to the bedside.  Navigator available for reassessment of patient.   Charmaine Pines, RN, BSN [facility] Heart Failure Navigator Secure Chat Only

## 2023-09-30 NOTE — Progress Notes (Signed)
 PROGRESS NOTE  Bethany Villegas, is a 89 y.o. female, DOB - September 20, 1934, FMW:[id number]  Admit date - 09/28/2023   Admitting Physician Bethany DELENA Grams, MD  Outpatient Primary MD for the patient is Bethany [name], MD  LOS - 2  Chief Complaint  Patient presents with   Shortness of Breath        Brief Narrative:  89 year old active female with a history of paroxysmal A-fib on Eliquis , GERD, Graves' disease, hypothyroidism, vitamin D  deficiency admitted on 09/28/2022 with acute on chronic systolic dysfunction CHF    -Assessment and Plan: 1)Acute systolic dysfunction CHF -No prior history of CHF -  BNP 1,839.0, chest x-ray on admission consistent with CHF Echo from 09/29/2022 with EF of 40 to 45%, global hypokinesis, moderate LVH,, No pulmonary hypertension, patient with moderate MR < no aortic stenosis 09/30/23 -Patient developed hypotension with BP 80/48 MMHG -Give diuretic holiday-hold IV Lasix  -Normal saline IV bolus to 50 mL x 1---BP improving after above-measures - Soft BP limiting his ability to add GDMT  -Weaned off oxygen - Continue daily weights, strict I's and O's and ReDs vest - Repeat chest x-ray on 10/01/2022  2)Elevated troponin - Denies any chest pain or shortness of breath -Likely ischemic demand due to A-fib, CHF, hypoxia with acute  hypoxic respiratory failure --Troponin 20 >>20 >>22>>22 - EKG with A-fib with RVR, no ACS type changes - No aspirin due to need for Eliquis  - Currently on metoprolol  for A-fib  3)PAFIb with RVR--- continue metoprolol  for rate control -Consider decreasing dose of metoprolol  if BP remains soft - Continue age and weight adjusted dose of Eliquis  for stroke prophylaxis  4) acute hypoxic respiratory failure--due to #1 above -Weaned off oxygen completely after diuresis as above #1 - 5)Hypokalemia--normalized after replacement   6)Hypothyroidism--TSH is 11.7 - Increased levothyroxine  to 62.5 mcg from 50 mcg  Status is: Inpatient    Disposition: The patient is from: Home              Anticipated d/c is to: Home              Anticipated d/c date is: 2 days              Patient currently is not medically stable to d/c. Barriers: Not Clinically Stable-   Code Status :  -  Code Status: Full Code   Family Communication:    (patient is alert, awake and coherent)   Discussed with son at bedside   DVT Prophylaxis  :   - SCDs  apixaban  (ELIQUIS ) tablet 2.5 mg Start: 09/28/23 1445 SCDs Start: 09/28/23 1439 apixaban  (ELIQUIS ) tablet 2.5 mg   Lab Results  Component Value Date   PLT 251 09/30/2023    Inpatient Medications  Scheduled Meds:  apixaban   2.5 mg Oral BID   calcium  carbonate  1,250 mg Oral Daily   [START ON 10/01/2023] furosemide   20 mg Intravenous Q12H   [START ON 10/01/2023] levothyroxine   62.5 mcg Oral Q0600   [START ON 10/01/2023] metoprolol  succinate  50 mg Oral Daily   multivitamin with minerals  1 tablet Oral Daily   pneumococcal 20-valent conjugate vaccine  0.5 mL Intramuscular Tomorrow-1000   sodium chloride  flush  3 mL Intravenous Q12H   sodium chloride  flush  3 mL Intravenous Q12H   vitamin B-12  100 mcg Oral Daily   Vitamin D  (Ergocalciferol )  50,000 Units Oral Weekly   Continuous Infusions: PRN Meds:.acetaminophen  **OR** acetaminophen , bisacodyl , hydrALAZINE , HYDROmorphone  (DILAUDID ) injection, ipratropium, levalbuterol , ondansetron  **  OR** ondansetron  (ZOFRAN ) IV, oxyCODONE , senna-docusate, sodium phosphate, traZODone    Anti-infectives (From admission, onward)    None      Subjective: Bethany Villegas today has no fevers, no emesis,  No chest pain,    -Patient developed hypotension with BP 80/48 MMHG -Give diuretic holiday-hold IV Lasix  -Normal saline IV bolus to 50 mL x 1---BP improving after above-measures - Soft BP limiting his ability to add GDMT  -Weaned off oxygen  Objective: Vitals:   09/30/23 1414 09/30/23 1419 09/30/23 1523 09/30/23 1714  BP: (!) 83/47 (!) 80/48 (!)  89/58 113/82  Pulse: 81 80 91   Resp: 18  16   Temp: 97.9 F (36.6 C)  98 F (36.7 C)   TempSrc: Oral  Oral   SpO2: 98%  98%   Weight:      Height:        Intake/Output Summary (Last 24 hours) at 09/30/2023 1927 Last data filed at 09/30/2023 1105 Gross per 24 hour  Intake 480 ml  Output --  Net 480 ml   Filed Weights   09/28/23 1538 09/29/23 0412 09/30/23 0428  Weight: 52.6 kg 48.5 kg 48.2 kg    Physical Exam Gen:- Awake Alert,  in no apparent distress , no conversational dyspnea HEENT:- [place].AT, No sclera icterus Neck-Supple Neck,No JVD,.  Lungs-improving air movement,, faint bibasilar rales  CV- S1, S2 normal, irregularly irregular Abd-  +ve B.Sounds, Abd Soft, No tenderness,    Extremity/Skin:- No  edema, pedal pulses present  Psych-affect is appropriate, oriented x3 Neuro-no new focal deficits, no tremors  Data Reviewed: I have personally reviewed following labs and imaging studies  CBC: Recent Labs  Lab 09/28/23 1230 09/29/23 0506 09/30/23 0422  WBC 7.3 5.8 7.6  NEUTROABS 5.4  --   --   HGB 13.5 13.7 14.0  HCT 41.6 40.3 40.7  MCV 105.1* 102.0* 100.5*  PLT 229 234 251   Basic Metabolic Panel: Recent Labs  Lab 09/28/23 1230 09/29/23 0506 09/30/23 0422  NA 138 139 138  K 4.1 3.2* 4.0  CL 106 102 102  CO2 23 27 26   GLUCOSE 123* 93 84  BUN 15 16 18   CREATININE 1.07* 1.04* 1.03*  CALCIUM  8.9 8.7* 8.9  MG 2.2  --   --   PHOS 3.7  --   --    GFR: Estimated Creatinine Clearance: 29.3 mL/min (A) (by C-G formula based on SCr of 1.03 mg/dL (H)). Liver Function Tests: Recent Labs  Lab 09/28/23 1230  AST 35  ALT 28  ALKPHOS 66  BILITOT 1.9*  PROT 6.8  ALBUMIN 3.8   Recent Results (from the past 240 hours)  Resp panel by RT-PCR (RSV, Flu A&B, Covid) Anterior Nasal Swab     Status: None   Collection Time: 09/28/23  1:08 PM   Specimen: Anterior Nasal Swab  Result Value Ref Range Status   SARS Coronavirus 2 by RT PCR NEGATIVE NEGATIVE Final     Comment: (NOTE) SARS-CoV-2 target nucleic acids are NOT DETECTED.  The SARS-CoV-2 RNA is generally detectable in upper respiratory specimens during the acute phase of infection. The lowest concentration of SARS-CoV-2 viral copies this assay can detect is 138 copies/mL. A negative result does not preclude SARS-Cov-2 infection and should not be used as the sole basis for treatment or other patient management decisions. A negative result may occur with  improper specimen collection/handling, submission of specimen other than nasopharyngeal swab, presence of viral mutation(s) within the areas targeted by this assay,  and inadequate number of viral copies(<138 copies/mL). A negative result must be combined with clinical observations, patient history, and epidemiological information. The expected result is Negative.  Fact Sheet for Patients:  BloggerCourse.com  Fact Sheet for Healthcare Providers:  SeriousBroker.it  This test is no t yet approved or cleared by the [place]  FDA and  has been authorized for detection and/or diagnosis of SARS-CoV-2 by FDA under an Emergency Use Authorization (EUA). This EUA will remain  in effect (meaning this test can be used) for the duration of the COVID-19 declaration under Section 564(b)(1) of the Act, 21 U.S.C.section 360bbb-3(b)(1), unless the authorization is terminated  or revoked sooner.       Influenza A by PCR NEGATIVE NEGATIVE Final   Influenza B by PCR NEGATIVE NEGATIVE Final    Comment: (NOTE) The Xpert Xpress SARS-CoV-2/FLU/RSV plus assay is intended as an aid in the diagnosis of influenza from Nasopharyngeal swab specimens and should not be used as a sole basis for treatment. Nasal washings and aspirates are unacceptable for Xpert Xpress SARS-CoV-2/FLU/RSV testing.  Fact Sheet for Patients: BloggerCourse.com  Fact Sheet for Healthcare  Providers: SeriousBroker.it  This test is not yet approved or cleared by the [place]  FDA and has been authorized for detection and/or diagnosis of SARS-CoV-2 by FDA under an Emergency Use Authorization (EUA). This EUA will remain in effect (meaning this test can be used) for the duration of the COVID-19 declaration under Section 564(b)(1) of the Act, 21 U.S.C. section 360bbb-3(b)(1), unless the authorization is terminated or revoked.     Resp Syncytial Virus by PCR NEGATIVE NEGATIVE Final    Comment: (NOTE) Fact Sheet for Patients: BloggerCourse.com  Fact Sheet for Healthcare Providers: SeriousBroker.it  This test is not yet approved or cleared by the [place]  FDA and has been authorized for detection and/or diagnosis of SARS-CoV-2 by FDA under an Emergency Use Authorization (EUA). This EUA will remain in effect (meaning this test can be used) for the duration of the COVID-19 declaration under Section 564(b)(1) of the Act, 21 U.S.C. section 360bbb-3(b)(1), unless the authorization is terminated or revoked.  Performed at [facility], [address], [place], [place] [address]     Radiology Studies: ECHOCARDIOGRAM COMPLETE Result Date: 09/29/2023    ECHOCARDIOGRAM REPORT   Patient Name:   MARSHAYLA MITSCHKE Date of Exam: 09/29/2023 Medical Rec #:  [id number]        Height:       62.5 in Accession #:    [id number]       Weight:       106.9 lb Date of Birth:  11-11-1934        BSA:          1.474 m Patient Age:    89 years         BP:           114/69 mmHg Patient Gender: F                HR:           93 bpm. Exam Location:  Inpatient Procedure: 2D Echo, Color Doppler and Cardiac Doppler (Both Spectral and Color            Flow Doppler were utilized during procedure). Indications:    CHF- Acute Systolic I50.21  History:        Patient has no prior history of Echocardiogram examinations.  CHF,  Arrythmias:Atrial Fibrillation; Signs/Symptoms:Shortness of                 Breath and Dyspnea.  Sonographer:    Koleen Popper RDCS Referring Phys: [phone] SEYED A SHAHMEHDI IMPRESSIONS  1. Left ventricular ejection fraction, by estimation, is 40 to 45%. The left ventricle has mildly decreased function. The left ventricle demonstrates global hypokinesis. There is moderate concentric left ventricular hypertrophy. Left ventricular diastolic parameters are indeterminate.  2. Right ventricular systolic function is mildly reduced. The right ventricular size is normal. There is normal pulmonary artery systolic pressure. The estimated right ventricular systolic pressure is 29.7 mmHg.  3. Left pleural effusion noted.  4. The mitral valve is grossly normal. Moderate mitral valve regurgitation.  5. Tricuspid valve regurgitation is moderate.  6. The aortic valve is tricuspid. There is mild calcification of the aortic valve. Aortic valve regurgitation is trivial. Aortic valve sclerosis is present, with no evidence of aortic valve stenosis.  7. The inferior vena cava is normal in size with <50% respiratory variability, suggesting right atrial pressure of 8 mmHg. Comparison(s): Prior images unable to be directly viewed. FINDINGS  Left Ventricle: Left ventricular ejection fraction, by estimation, is 40 to 45%. The left ventricle has mildly decreased function. The left ventricle demonstrates global hypokinesis. The left ventricular internal cavity size was normal in size. There is  moderate concentric left ventricular hypertrophy. Left ventricular diastolic function could not be evaluated due to atrial fibrillation. Left ventricular diastolic parameters are indeterminate. Right Ventricle: The right ventricular size is normal. No increase in right ventricular wall thickness. Right ventricular systolic function is mildly reduced. There is normal pulmonary artery systolic pressure. The tricuspid regurgitant velocity is 2.33 m/s, and  with an assumed right atrial pressure of 8 mmHg, the estimated right ventricular systolic pressure is 29.7 mmHg. Left Atrium: Left atrial size was normal in size. Right Atrium: Right atrial size was normal in size. Pericardium: Left pleural effusion noted. There is no evidence of pericardial effusion. Mitral Valve: The mitral valve is grossly normal. Moderate mitral valve regurgitation, with posteriorly-directed jet. Tricuspid Valve: The tricuspid valve is grossly normal. Tricuspid valve regurgitation is moderate. Aortic Valve: The aortic valve is tricuspid. There is mild calcification of the aortic valve. Aortic valve regurgitation is trivial. Aortic regurgitation PHT measures 1373 msec. Aortic valve sclerosis is present, with no evidence of aortic valve stenosis. Pulmonic Valve: The pulmonic valve was grossly normal. Pulmonic valve regurgitation is trivial. Aorta: The aortic root and ascending aorta are structurally normal, with no evidence of dilitation. Venous: The inferior vena cava is normal in size with less than 50% respiratory variability, suggesting right atrial pressure of 8 mmHg. IAS/Shunts: No atrial level shunt detected by color flow Doppler. Additional Comments: 3D was performed not requiring image post processing on an independent workstation and was indeterminate.  LEFT VENTRICLE PLAX 2D LVIDd:         3.00 cm LVIDs:         2.40 cm LV PW:         1.60 cm LV IVS:        1.40 cm LVOT diam:     1.60 cm LV SV:         25 LV SV Index:   17 LVOT Area:     2.01 cm  RIGHT VENTRICLE             IVC RV Basal diam:  2.80 cm     IVC diam: 1.70 cm  RV S prime:     10.40 cm/s TAPSE (M-mode): 1.1 cm LEFT ATRIUM             Index        RIGHT ATRIUM           Index LA diam:        3.70 cm 2.51 cm/m   RA Area:     12.60 cm LA Vol (A2C):   55.1 ml 37.39 ml/m  RA Volume:   22.70 ml  15.40 ml/m LA Vol (A4C):   43.7 ml 29.65 ml/m LA Biplane Vol: 49.7 ml 33.72 ml/m  AORTIC VALVE LVOT Vmax:   77.00 cm/s LVOT Vmean:   48.200 cm/s LVOT VTI:    0.123 m AI PHT:      1373 msec  AORTA Ao Root diam: 3.70 cm Ao Asc diam:  3.20 cm MR Peak grad: 82.4 mmHg   TRICUSPID VALVE MR Vmax:      454.00 cm/s TR Peak grad:   21.7 mmHg                           TR Vmax:        233.00 cm/s                            SHUNTS                           Systemic VTI:  0.12 m                           Systemic Diam: 1.60 cm Jayson Sierras MD Electronically signed by Jayson Sierras MD Signature Date/Time: 09/29/2023/10:14:18 AM    Final    Scheduled Meds:  apixaban   2.5 mg Oral BID   calcium  carbonate  1,250 mg Oral Daily   [START ON 10/01/2023] furosemide   20 mg Intravenous Q12H   [START ON 10/01/2023] levothyroxine   62.5 mcg Oral Q0600   [START ON 10/01/2023] metoprolol  succinate  50 mg Oral Daily   multivitamin with minerals  1 tablet Oral Daily   pneumococcal 20-valent conjugate vaccine  0.5 mL Intramuscular Tomorrow-1000   sodium chloride  flush  3 mL Intravenous Q12H   sodium chloride  flush  3 mL Intravenous Q12H   vitamin B-12  100 mcg Oral Daily   Vitamin D  (Ergocalciferol )  50,000 Units Oral Weekly   Continuous Infusions:   LOS: 2 days    Rendall Carwin M.D on 09/30/2023 at 7:27 PM  Go to [email or web address] - for contact info  [facility] - Office  [phone]  If 7PM-7AM, please contact night-coverage [email or web address] 09/30/2023, 7:27 PM

## 2023-09-30 NOTE — Progress Notes (Signed)
 Heart Failure Nurse Navigator Progress Note  PCP: Bethany [name], MD PCP-Cardiologist: Bethany Mallipeddi, MD Admission Diagnosis: SOB (shortness of breath) Atrial fibrillation, unspecified type [facility]) Admitted from: Home  [facility] patient: Patient called remotely to room 318 @ [facility] to provide CHF education and schedule AHF TOC appointment.  Patient preferred [name] location and a appointment letter to be mailed to her house. She preferred that method so she could change it if needed from home depending on her schedule. To be mailed out on 10/01/2023. Reached out to patient nursing staff to provide her with HF education folder.  Presentation:   Bethany Villegas presented with shortness of breath that has been going on for months. Patient reported that she had pneumonia in May and her breathing has not felt right since then.  She works at [employer] and noticed increasing shortness of breath with walking short distances as well as generalized weakness,decreased appetite, unproductive cough.  Saw cardiology in May and Eliquis  and metoprolol  dosing decreased. Recently new diagnosis of  Atrial Fibrillation. Patient denied fevers, chills, chest pain or recent illness. BNP 1839. HS-Troponin 20. Chest x-ray: Cardiomegaly suggestive of small bilateral pleural effusions.  ECHO/ LVEF: 40-45% (09/29/23).  Clinical Course:  Past Medical History:  Diagnosis Date   A-fib (HCC)    GERD (gastroesophageal reflux disease)    Graves disease    Hypothyroid    Kidney stones      Social History   Socioeconomic History   Marital status: Single    Spouse name: Not on file   Number of children: Not on file   Years of education: Not on file   Highest education level: Not on file  Occupational History   Not on file  Tobacco Use   Smoking status: Never   Smokeless tobacco: Never  Substance and Sexual Activity   Alcohol  use: No    Alcohol /week: 0.0 standard drinks of alcohol    Drug use: No    Sexual activity: Not on file  Other Topics Concern   Not on file  Social History Narrative   Not on file   Social Drivers of Health   Financial Resource Strain: Not on file  Food Insecurity: No Food Insecurity (09/28/2023)   Hunger Vital Sign    Worried About Running Out of Food in the Last Year: Never true    Ran Out of Food in the Last Year: Never true  Transportation Needs: No Transportation Needs (09/28/2023)   PRAPARE - Administrator, Civil Service (Medical): No    Lack of Transportation (Non-Medical): No  Physical Activity: Not on file  Stress: Not on file  Social Connections: Moderately Isolated (09/28/2023)   Social Connection and Isolation Panel    Frequency of Communication with Friends and Family: More than three times a week    Frequency of Social Gatherings with Friends and Family: Twice a week    Attends Religious Services: 1 to 4 times per year    Active Member of [employer] or Organizations: No    Attends Banker Meetings: Never    Marital Status: Widowed   Education Assessment and Provision:  Detailed education and instructions provided on heart failure disease management including the following:  Signs and symptoms of Heart Failure When to call the physician Importance of daily weights Low sodium diet Fluid restriction Medication management Anticipated future follow-up appointments  Patient education given on each of the above topics.  Patient acknowledges understanding via teach back method  and acceptance of all instructions.  Education Materials:  Living Better With Heart Failure Booklet, HF zone tool, & Daily Weight Tracker Tool.  Patient has scale at home: Yes Patient has pill box at home: Yes    High Risk Criteria for Readmission and/or Poor Patient Outcomes: Heart failure hospital admissions (last 6 months): 1  No Show rate: 0 Difficult social situation: None Demonstrates medication adherence: Yes Primary Language:  English Literacy level: Reading, Writing, & Comprehension  Barriers of Care:   New Congestive Heart Failure Diagnosis and shared that she has never had to take many medications in the past.  Considerations/Referrals:  Referral made to Heart Failure Pharmacist Stewardship: No Referral made to Heart Failure CSW/NCM TOC: No Referral made to Heart & Vascular TOC clinic: Yes.  [facility] Clinic [facility] 10/08/2023 @ 9:45.  Items for Follow-up on DC/TOC: Diet & Fluid Restrictions Daily Weights Continued Heart Failure Education  Charmaine Pines, RN, BSN [facility] Heart Failure Navigator Secure Chat Only

## 2023-09-30 NOTE — Progress Notes (Signed)
   09/30/23 1419  Vitals  BP (!) 80/48  BP Method Manual  Pulse Rate 80  Level of Consciousness  Level of Consciousness Alert   MD notified. Pt resting in bed denies any dizziness at this time.

## 2023-10-01 ENCOUNTER — Inpatient Hospital Stay (HOSPITAL_COMMUNITY)

## 2023-10-01 DIAGNOSIS — E0500 Thyrotoxicosis with diffuse goiter without thyrotoxic crisis or storm: Secondary | ICD-10-CM

## 2023-10-01 DIAGNOSIS — E876 Hypokalemia: Secondary | ICD-10-CM | POA: Diagnosis not present

## 2023-10-01 DIAGNOSIS — K219 Gastro-esophageal reflux disease without esophagitis: Secondary | ICD-10-CM

## 2023-10-01 DIAGNOSIS — I480 Paroxysmal atrial fibrillation: Secondary | ICD-10-CM | POA: Diagnosis not present

## 2023-10-01 DIAGNOSIS — I5033 Acute on chronic diastolic (congestive) heart failure: Secondary | ICD-10-CM | POA: Diagnosis not present

## 2023-10-01 LAB — CBC
HCT: 42.5 % (ref 36.0–46.0)
Hemoglobin: 14.7 g/dL (ref 12.0–15.0)
MCH: 34.3 pg — ABNORMAL HIGH (ref 26.0–34.0)
MCHC: 34.6 g/dL (ref 30.0–36.0)
MCV: 99.3 fL (ref 80.0–100.0)
Platelets: 253 K/uL (ref 150–400)
RBC: 4.28 MIL/uL (ref 3.87–5.11)
RDW: 13.3 % (ref 11.5–15.5)
WBC: 7.6 K/uL (ref 4.0–10.5)
nRBC: 0 % (ref 0.0–0.2)

## 2023-10-01 LAB — BASIC METABOLIC PANEL WITH GFR
Anion gap: 15 (ref 5–15)
BUN: 20 mg/dL (ref 8–23)
CO2: 23 mmol/L (ref 22–32)
Calcium: 8.9 mg/dL (ref 8.9–10.3)
Chloride: 98 mmol/L (ref 98–111)
Creatinine, Ser: 1.15 mg/dL — ABNORMAL HIGH (ref 0.44–1.00)
GFR, Estimated: 46 mL/min — ABNORMAL LOW (ref >60)
Glucose, Bld: 80 mg/dL (ref 70–99)
Potassium: 3.5 mmol/L (ref 3.5–5.1)
Sodium: 136 mmol/L (ref 135–145)

## 2023-10-01 LAB — GLUCOSE, CAPILLARY: Glucose-Capillary: 108 mg/dL — ABNORMAL HIGH (ref 70–99)

## 2023-10-01 LAB — MAGNESIUM: Magnesium: 2.4 mg/dL (ref 1.7–2.4)

## 2023-10-01 LAB — BRAIN NATRIURETIC PEPTIDE: B Natriuretic Peptide: 1277 pg/mL — ABNORMAL HIGH (ref 0.0–100.0)

## 2023-10-01 MED ORDER — SPIRONOLACTONE 12.5 MG HALF TABLET
12.5000 mg | ORAL_TABLET | Freq: Every day | ORAL | Status: DC
  Administered 2023-10-02: 12.5 mg via ORAL
  Filled 2023-10-01: qty 1

## 2023-10-01 NOTE — Assessment & Plan Note (Addendum)
 At the time of discharge her renal function has a serum cr of 1,0 with K at 3,3 and serum bicarbonate at 26  Na 137   Follow up renal function and electrolytes as outpatient. Continue spironolactone  and SGLT 2 inh As needed furosemide .

## 2023-10-01 NOTE — Progress Notes (Signed)
   10/01/23 0526  ReDS Vest / Clip  BMI (Calculated) 19.42  Station Marker A  Ruler Value 31  ReDS Value Range < 36  ReDS Actual Value 28

## 2023-10-01 NOTE — Progress Notes (Signed)
  Progress Note   Patient: Bethany Villegas FMW:[id number] DOB: December 16, 1934 DOA: 09/28/2023     3 DOS: the patient was seen and examined on 10/01/2023   Brief hospital course: 89 year old active female with a history of paroxysmal A-fib on Eliquis , GERD, Graves' disease, hypothyroidism, vitamin D  deficiency admitted on 09/28/2022 with acute on chronic systolic dysfunction CHF   Assessment and Plan: * Acute on chronic diastolic CHF (congestive heart failure) (HCC) Echocardiogram with mildly reduced systolic function with EF 40 to 45%, global hypokinesis, moderate LVH, RV with mild reduction in systolic function, RVSP 29.7 mmHg, moderate mitral valve regurgitation, moderate tricuspid regurgitation,   Volume status has improved,  Urine output is documented at 500 cc but patient has lost 2 Kg since yesterday.  6 kg since admission.  Systolic blood pressure 80 today   Plan to continue metoprolol  succinate.  Add spironolactone , SGLT 2 inh and afterload reduction with ARB of blood pressure allows.  Hold on loop diuretic for now.   Paroxysmal atrial fibrillation (HCC) Continue rate control with metoprolol  and anticoagulation with apixaban   Hypokalemia Renal function with serum 1.15 with K at 3,5 and serum bicarbonate at 23  Na 136 Mg 2.4   Plan to hold on loop diuretic for now Follow up renal function and electrolytes in am.  Avoid hypotension and nephrotoxic medications.   GERD (gastroesophageal reflux disease) Continue PPI  Graves disease TSH 11,69 and free T4 1,35 with normal T3.       Subjective: Patient is feeling better, but not yet back to baseline, continue very weak and deconditioned   Physical Exam: Vitals:   10/01/23 0526 10/01/23 0813 10/01/23 1102 10/01/23 1418  BP: 109/67 (!) 124/110 100/69 (!) 81/60  Pulse: 98 (!) 119 94 97  Resp: 19  20   Temp: 97.7 F (36.5 C)   97.8 F (36.6 C)  TempSrc: Oral   Oral  SpO2: 97%   96%  Weight: 46.6 kg     Height: 5' 1 (1.549  m)      Neurology awake and alert, deconditioned ENT with mild pallor with no icterus Cardiovascular with S1 and S2 present and irregularly irregular, with no gallops, rubs, positive  systolic murmur at the apex  Respiratory with mild rales at bases with no wheezing or rhonchi  Abdomen with no distention  No lower extremity edema   Data Reviewed:    Family Communication: no family at the bedside   Disposition: Status is: Inpatient Remains inpatient appropriate because: recovering heart failure   Planned Discharge Destination: Home     Author: Elidia Toribio Furnace, MD 10/01/2023 4:25 PM  For on call review [email or web address].

## 2023-10-01 NOTE — Progress Notes (Signed)
 Telemetry called saying this patient was in Afib with RVR. HR 140s-150s. A 12-lead EKG was gotten, it showed- Vent rate 109 Afib with RVR. Oncall MD Opyd made aware. Paper placed in chart.

## 2023-10-01 NOTE — Assessment & Plan Note (Addendum)
 Echocardiogram with mildly reduced systolic function with EF 40 to 45%, global hypokinesis, moderate LVH, RV with mild reduction in systolic function, RVSP 29.7 mmHg, moderate mitral valve regurgitation, moderate tricuspid regurgitation,   Patient was placed on IV furosemide  for diuresis, negative fluid balance was achieved, with improvement in her symptoms.  Plan to continue metoprolol  succinate.  Spironolactone , and SGLT 2 inh  Hold on ARB due to risk of hypotension Loop diuretic as needed. Follow up as outpatient.

## 2023-10-01 NOTE — Care Management Important Message (Signed)
 Important Message  Patient Details  Name: Bethany Villegas MRN: [id number] Date of Birth: Feb 13, 1935   Important Message Given:  Yes - Medicare IM     [name] L Rendon Howell 10/01/2023, 12:31 PM

## 2023-10-02 DIAGNOSIS — I480 Paroxysmal atrial fibrillation: Secondary | ICD-10-CM | POA: Diagnosis not present

## 2023-10-02 DIAGNOSIS — I5033 Acute on chronic diastolic (congestive) heart failure: Secondary | ICD-10-CM | POA: Diagnosis not present

## 2023-10-02 DIAGNOSIS — E876 Hypokalemia: Secondary | ICD-10-CM | POA: Diagnosis not present

## 2023-10-02 DIAGNOSIS — K219 Gastro-esophageal reflux disease without esophagitis: Secondary | ICD-10-CM | POA: Diagnosis not present

## 2023-10-02 LAB — CBC
HCT: 43.3 % (ref 36.0–46.0)
Hemoglobin: 15 g/dL (ref 12.0–15.0)
MCH: 34.4 pg — ABNORMAL HIGH (ref 26.0–34.0)
MCHC: 34.6 g/dL (ref 30.0–36.0)
MCV: 99.3 fL (ref 80.0–100.0)
Platelets: 247 K/uL (ref 150–400)
RBC: 4.36 MIL/uL (ref 3.87–5.11)
RDW: 13.1 % (ref 11.5–15.5)
WBC: 6.8 K/uL (ref 4.0–10.5)
nRBC: 0 % (ref 0.0–0.2)

## 2023-10-02 LAB — BASIC METABOLIC PANEL WITH GFR
Anion gap: 11 (ref 5–15)
BUN: 21 mg/dL (ref 8–23)
CO2: 26 mmol/L (ref 22–32)
Calcium: 8.9 mg/dL (ref 8.9–10.3)
Chloride: 100 mmol/L (ref 98–111)
Creatinine, Ser: 1.01 mg/dL — ABNORMAL HIGH (ref 0.44–1.00)
GFR, Estimated: 54 mL/min — ABNORMAL LOW (ref >60)
Glucose, Bld: 93 mg/dL (ref 70–99)
Potassium: 3.3 mmol/L — ABNORMAL LOW (ref 3.5–5.1)
Sodium: 137 mmol/L (ref 135–145)

## 2023-10-02 LAB — GLUCOSE, CAPILLARY: Glucose-Capillary: 116 mg/dL — ABNORMAL HIGH (ref 70–99)

## 2023-10-02 MED ORDER — SPIRONOLACTONE 25 MG PO TABS: 12.5000 mg | ORAL_TABLET | Freq: Every day | ORAL | 0 refills | Status: DC

## 2023-10-02 MED ORDER — EMPAGLIFLOZIN 10 MG PO TABS: 10.0000 mg | ORAL_TABLET | Freq: Every day | ORAL | Status: DC

## 2023-10-02 MED ORDER — FUROSEMIDE 20 MG PO TABS: 20.0000 mg | ORAL_TABLET | Freq: Every day | ORAL | 0 refills | Status: DC | PRN

## 2023-10-02 MED ORDER — METOPROLOL SUCCINATE ER 50 MG PO TB24: 50.0000 mg | ORAL_TABLET | Freq: Every day | ORAL | 0 refills | Status: DC

## 2023-10-02 MED ORDER — EMPAGLIFLOZIN 10 MG PO TABS: 10.0000 mg | ORAL_TABLET | Freq: Every day | ORAL | 0 refills | Status: DC

## 2023-10-02 MED ORDER — FUROSEMIDE 20 MG PO TABS: 20.0000 mg | ORAL_TABLET | Freq: Every day | ORAL | Status: DC | PRN

## 2023-10-02 MED ORDER — POTASSIUM CHLORIDE CRYS ER 20 MEQ PO TBCR
40.0000 meq | EXTENDED_RELEASE_TABLET | ORAL | Status: AC
  Administered 2023-10-02 (×2): 40 meq via ORAL
  Filled 2023-10-02 (×2): qty 2

## 2023-10-02 NOTE — Discharge Instructions (Addendum)
  [facility] (RN & physical therapy) [place] will contact you to  [address], [address]       [place], [place], [address] [phone]

## 2023-10-02 NOTE — Discharge Summary (Signed)
 Physician Discharge Summary   Patient: Bethany Villegas MRN: [id number] DOB: [birth year]  Admit date:     09/28/2023  Discharge date: 10/02/23  Discharge Physician: Elidia Sieving Abhi Moccia   PCP: Rosamond [name], MD   Recommendations at discharge:    Patient was placed on spironolactone  and SGLT 2 inh for heart failure guideline medical therapy.  To consider ARB or Ace inh as outpatient if blood pressure stable.  Increased dose of metoprolol  for better rate control atrial fibrillation.  Take furosemide  as needed for volume overload, weight increase 2 to 3 lbs in 24 hrs or 5 lbs in 7 days  Follow up renal function and electrolytes in 7. Follow up with Dr Rosamond in 7 to 10 days  Home health services.   Discharge Diagnoses: Principal Problem:   Acute on chronic diastolic CHF (congestive heart failure) (HCC) Active Problems:   Paroxysmal atrial fibrillation (HCC)   Hypokalemia   GERD (gastroesophageal reflux disease)   Graves disease  Resolved Problems:   * No resolved hospital problems. [facility] Course: Bethany Villegas was admitted to the hospital with the working diagnosis of heart failure exacerbation.   89 year old active female with a history of paroxysmal A-fib, GERD, Graves' disease, hypothyroidism, vitamin D  deficiency who presented with dyspnea.  Reported generalized weakness, and poor appetite, with progressive dyspnea. On her initial physical examination her blood pressure was 122/98, HR 103 RR 25 and 02 saturation 95%  Lungs with bilateral rales, with no wheezing, heart with S1 and S2 present irregularly irregular, with no gallops, rubs or murmurs, abdomen with no distention and no lower extremity edema.   Na 138, K 4.1 Cl 106 bicarbonate 23, glucose 123 bun 15 cr 1,0  AST 35 and ALT 28  BNP 1,839  High sensitive troponin 20  Wbc 7,3 hgb 13.5 plt 229  Sars covid 19 negative Influenza negative RSV negative   Chest radiograph with cardiomegaly with bilateral hilar  vascular congestion   EKG 132 bpm, right axis deviation, qtc 439, atrial fibrillation with no significant ST segment changes, negative T wave lead II, III, avF, V4 to V6.  Patient was placed on furosemide  for diuresis  Metoprolol  was increased   Assessment and Plan: * Acute on chronic diastolic CHF (congestive heart failure) (HCC) Echocardiogram with mildly reduced systolic function with EF 40 to 45%, global hypokinesis, moderate LVH, RV with mild reduction in systolic function, RVSP 29.7 mmHg, moderate mitral valve regurgitation, moderate tricuspid regurgitation,   Patient was placed on IV furosemide  for diuresis, negative fluid balance was achieved, with improvement in her symptoms.  Plan to continue metoprolol  succinate.  Spironolactone , and SGLT 2 inh  Hold on ARB due to risk of hypotension Loop diuretic as needed. Follow up as outpatient.  Paroxysmal atrial fibrillation (HCC) Continue rate control with metoprolol  and anticoagulation with apixaban   Hypokalemia At the time of discharge her renal function has a serum cr of 1,0 with K at 3,3 and serum bicarbonate at 26  Na 137   Follow up renal function and electrolytes as outpatient. Continue spironolactone  and SGLT 2 inh As needed furosemide .   GERD (gastroesophageal reflux disease) Continue PPI  Graves disease TSH 11,69 and free T4 1,35 with normal T3.   Follow thyroid  function test as outpatient        Consultants: none  Procedures performed: none   Disposition: Home Diet recommendation:  Cardiac diet DISCHARGE MEDICATION: Allergies as of 10/02/2023       Reactions   Penicillins  Swelling, Rash   Arm swelled   Sulfa Antibiotics Rash        Medication List     TAKE these medications    apixaban  2.5 MG Tabs tablet Commonly known as: Eliquis  Take 1 tablet (2.5 mg total) by mouth 2 (two) times daily.   calcium  carbonate 1250 (500 Ca) MG chewable tablet Commonly known as: OS-CAL Chew 1 tablet by mouth  daily.   empagliflozin  10 MG Tabs tablet Commonly known as: JARDIANCE  Take 1 tablet (10 mg total) by mouth daily.   furosemide  20 MG tablet Commonly known as: LASIX  Take 1 tablet (20 mg total) by mouth daily as needed for edema or fluid (wieght gain 2 to 3 lbs in 24 hrs or 5 lbs in 7 days).   levothyroxine  50 MCG tablet Commonly known as: SYNTHROID  Take 50 mcg by mouth daily.   metoprolol  succinate 50 MG 24 hr tablet Commonly known as: TOPROL -XL Take 1 tablet (50 mg total) by mouth daily. Take with or immediately following a meal. Start taking on: October 03, 2023 What changed:  medication strength how much to take additional instructions   multivitamin-iron-minerals-folic acid chewable tablet Chew 1 tablet by mouth daily.   spironolactone  25 MG tablet Commonly known as: ALDACTONE  Take 0.5 tablets (12.5 mg total) by mouth daily. Start taking on: October 03, 2023   vitamin B-12 100 MCG tablet Commonly known as: CYANOCOBALAMIN  Take 100 mcg by mouth daily.   Vitamin D  (Ergocalciferol ) 1.25 MG (50000 UNIT) Caps capsule Commonly known as: DRISDOL  Take 50,000 Units by mouth once a week.        Follow-up Information     [facility]. Go on 10/08/2023.   Specialty: Cardiology Why: Hospital Follow-Up 10/08/23 @ 9:45 [facility], (Entrance C off [address] Please bring all medications with you to follow-up appointment Free Valet Parking at the door Contact information: [address] [place]  [phone]               Discharge Exam: Fredricka Weights   09/30/23 0428 10/01/23 0526 10/02/23 0340  Weight: 48.2 kg 46.6 kg 50.8 kg   BP 114/75 (BP Location: Right Arm)   Pulse (!) 109   Temp 97.7 F (36.5 C) (Oral)   Resp 18   Ht 5' 1 (1.549 m)   Wt 50.8 kg   SpO2 96%   BMI 21.14 kg/m   Patient is feeling better, no chest pain or dyspnea, no nausea or  vomiting. No PND or  orthopnea, no palpitations  Neurology awake and alert ENT with mild pallor Cardiovascular with S1 and S2 present, irregularly irregular with no gallops, rubs, positive systolic murmur at the apex and right sternal border. Respiratory with no rales or wheezing, no rhonchi  Abdomen with no distention  No lower extremity edema   Condition at discharge: stable  The results of significant diagnostics from this hospitalization (including imaging, microbiology, ancillary and laboratory) are listed below for reference.   Imaging Studies: DG Chest 2 View Result Date: 10/01/2023 CLINICAL DATA:  Dyspnea. EXAM: CHEST - 2 VIEW COMPARISON:  09/28/2023. FINDINGS: Stable cardiomegaly. Mediastinal contours are within normal limits. Aortic atherosclerosis. Similar bilateral interstitial prominence, suspicious for interstitial pulmonary edema. Trace bilateral pleural effusions with slightly improved aeration at the lung bases. No pneumothorax. Degenerative changes of the thoracolumbar spine. No acute osseous abnormality. IMPRESSION: 1. Cardiomegaly with findings suspicious for interstitial pulmonary edema. 2. Trace bilateral pleural effusions with  slightly improved aeration at the lung bases. Electronically Signed   By: Harrietta Sherry M.D.   On: 10/01/2023 09:18   ECHOCARDIOGRAM COMPLETE Result Date: 09/29/2023    ECHOCARDIOGRAM REPORT   Patient Name:   SHAKEEMA LIPPMAN Date of Exam: 09/29/2023 Medical Rec #:  [id number]        Height:       62.5 in Accession #:    [id number]       Weight:       106.9 lb Date of Birth:  [birth year]        BSA:          1.474 m Patient Age:    89 years         BP:           114/69 mmHg Patient Gender: F                HR:           93 bpm. Exam Location:  Inpatient Procedure: 2D Echo, Color Doppler and Cardiac Doppler (Both Spectral and Color            Flow Doppler were utilized during procedure). Indications:    CHF- Acute Systolic I50.21  History:        Patient has no prior history  of Echocardiogram examinations.                 CHF, Arrythmias:Atrial Fibrillation; Signs/Symptoms:Shortness of                 Breath and Dyspnea.  Sonographer:    Koleen Popper RDCS Referring Phys: [phone] SEYED A SHAHMEHDI IMPRESSIONS  1. Left ventricular ejection fraction, by estimation, is 40 to 45%. The left ventricle has mildly decreased function. The left ventricle demonstrates global hypokinesis. There is moderate concentric left ventricular hypertrophy. Left ventricular diastolic parameters are indeterminate.  2. Right ventricular systolic function is mildly reduced. The right ventricular size is normal. There is normal pulmonary artery systolic pressure. The estimated right ventricular systolic pressure is 29.7 mmHg.  3. Left pleural effusion noted.  4. The mitral valve is grossly normal. Moderate mitral valve regurgitation.  5. Tricuspid valve regurgitation is moderate.  6. The aortic valve is tricuspid. There is mild calcification of the aortic valve. Aortic valve regurgitation is trivial. Aortic valve sclerosis is present, with no evidence of aortic valve stenosis.  7. The inferior vena cava is normal in size with <50% respiratory variability, suggesting right atrial pressure of 8 mmHg. Comparison(s): Prior images unable to be directly viewed. FINDINGS  Left Ventricle: Left ventricular ejection fraction, by estimation, is 40 to 45%. The left ventricle has mildly decreased function. The left ventricle demonstrates global hypokinesis. The left ventricular internal cavity size was normal in size. There is  moderate concentric left ventricular hypertrophy. Left ventricular diastolic function could not be evaluated due to atrial fibrillation. Left ventricular diastolic parameters are indeterminate. Right Ventricle: The right ventricular size is normal. No increase in right ventricular wall thickness. Right ventricular systolic function is mildly reduced. There is normal pulmonary artery systolic pressure.  The tricuspid regurgitant velocity is 2.33 m/s, and with an assumed right atrial pressure of 8 mmHg, the estimated right ventricular systolic pressure is 29.7 mmHg. Left Atrium: Left atrial size was normal in size. Right Atrium: Right atrial size was normal in size. Pericardium: Left pleural effusion noted. There is no evidence of pericardial effusion. Mitral Valve: The mitral valve is grossly normal. Moderate mitral valve  regurgitation, with posteriorly-directed jet. Tricuspid Valve: The tricuspid valve is grossly normal. Tricuspid valve regurgitation is moderate. Aortic Valve: The aortic valve is tricuspid. There is mild calcification of the aortic valve. Aortic valve regurgitation is trivial. Aortic regurgitation PHT measures 1373 msec. Aortic valve sclerosis is present, with no evidence of aortic valve stenosis. Pulmonic Valve: The pulmonic valve was grossly normal. Pulmonic valve regurgitation is trivial. Aorta: The aortic root and ascending aorta are structurally normal, with no evidence of dilitation. Venous: The inferior vena cava is normal in size with less than 50% respiratory variability, suggesting right atrial pressure of 8 mmHg. IAS/Shunts: No atrial level shunt detected by color flow Doppler. Additional Comments: 3D was performed not requiring image post processing on an independent workstation and was indeterminate.  LEFT VENTRICLE PLAX 2D LVIDd:         3.00 cm LVIDs:         2.40 cm LV PW:         1.60 cm LV IVS:        1.40 cm LVOT diam:     1.60 cm LV SV:         25 LV SV Index:   17 LVOT Area:     2.01 cm  RIGHT VENTRICLE             IVC RV Basal diam:  2.80 cm     IVC diam: 1.70 cm RV S prime:     10.40 cm/s TAPSE (M-mode): 1.1 cm LEFT ATRIUM             Index        RIGHT ATRIUM           Index LA diam:        3.70 cm 2.51 cm/m   RA Area:     12.60 cm LA Vol (A2C):   55.1 ml 37.39 ml/m  RA Volume:   22.70 ml  15.40 ml/m LA Vol (A4C):   43.7 ml 29.65 ml/m LA Biplane Vol: 49.7 ml 33.72  ml/m  AORTIC VALVE LVOT Vmax:   77.00 cm/s LVOT Vmean:  48.200 cm/s LVOT VTI:    0.123 m AI PHT:      1373 msec  AORTA Ao Root diam: 3.70 cm Ao Asc diam:  3.20 cm MR Peak grad: 82.4 mmHg   TRICUSPID VALVE MR Vmax:      454.00 cm/s TR Peak grad:   21.7 mmHg                           TR Vmax:        233.00 cm/s                            SHUNTS                           Systemic VTI:  0.12 m                           Systemic Diam: 1.60 cm Jayson Sierras MD Electronically signed by Jayson Sierras MD Signature Date/Time: 09/29/2023/10:14:18 AM    Final    DG Chest Portable 1 View Result Date: 09/28/2023 CLINICAL DATA:  Shortness of breath. EXAM: PORTABLE CHEST 1 VIEW COMPARISON:  None Available. FINDINGS: The heart is enlarged. Mediastinal contours are within normal limits.  Aortic atherosclerosis. Blunting of the bilateral costophrenic angles is suggestive of small bilateral pleural effusions. Bibasilar opacities could reflect atelectasis and/or infiltrate. No pneumothorax. No acute osseous abnormality. IMPRESSION: Cardiomegaly with blunting of the bilateral costophrenic angles, suggestive of small bilateral pleural effusions. Bibasilar opacities could reflect atelectasis and/or infiltrate. Electronically Signed   By: Harrietta Sherry M.D.   On: 09/28/2023 12:54    Microbiology: Results for orders placed or performed during the hospital encounter of 09/28/23  Resp panel by RT-PCR (RSV, Flu A&B, Covid) Anterior Nasal Swab     Status: None   Collection Time: 09/28/23  1:08 PM   Specimen: Anterior Nasal Swab  Result Value Ref Range Status   SARS Coronavirus 2 by RT PCR NEGATIVE NEGATIVE Final    Comment: (NOTE) SARS-CoV-2 target nucleic acids are NOT DETECTED.  The SARS-CoV-2 RNA is generally detectable in upper respiratory specimens during the acute phase of infection. The lowest concentration of SARS-CoV-2 viral copies this assay can detect is 138 copies/mL. A negative result does not preclude  SARS-Cov-2 infection and should not be used as the sole basis for treatment or other patient management decisions. A negative result may occur with  improper specimen collection/handling, submission of specimen other than nasopharyngeal swab, presence of viral mutation(s) within the areas targeted by this assay, and inadequate number of viral copies(<138 copies/mL). A negative result must be combined with clinical observations, patient history, and epidemiological information. The expected result is Negative.  Fact Sheet for Patients:  BloggerCourse.com  Fact Sheet for Healthcare Providers:  SeriousBroker.it  This test is no t yet approved or cleared by the [place]  FDA and  has been authorized for detection and/or diagnosis of SARS-CoV-2 by FDA under an Emergency Use Authorization (EUA). This EUA will remain  in effect (meaning this test can be used) for the duration of the COVID-19 declaration under Section 564(b)(1) of the Act, 21 U.S.C.section 360bbb-3(b)(1), unless the authorization is terminated  or revoked sooner.       Influenza A by PCR NEGATIVE NEGATIVE Final   Influenza B by PCR NEGATIVE NEGATIVE Final    Comment: (NOTE) The Xpert Xpress SARS-CoV-2/FLU/RSV plus assay is intended as an aid in the diagnosis of influenza from Nasopharyngeal swab specimens and should not be used as a sole basis for treatment. Nasal washings and aspirates are unacceptable for Xpert Xpress SARS-CoV-2/FLU/RSV testing.  Fact Sheet for Patients: BloggerCourse.com  Fact Sheet for Healthcare Providers: SeriousBroker.it  This test is not yet approved or cleared by the [place]  FDA and has been authorized for detection and/or diagnosis of SARS-CoV-2 by FDA under an Emergency Use Authorization (EUA). This EUA will remain in effect (meaning this test can be used) for the duration of  the COVID-19 declaration under Section 564(b)(1) of the Act, 21 U.S.C. section 360bbb-3(b)(1), unless the authorization is terminated or revoked.     Resp Syncytial Virus by PCR NEGATIVE NEGATIVE Final    Comment: (NOTE) Fact Sheet for Patients: BloggerCourse.com  Fact Sheet for Healthcare Providers: SeriousBroker.it  This test is not yet approved or cleared by the [place]  FDA and has been authorized for detection and/or diagnosis of SARS-CoV-2 by FDA under an Emergency Use Authorization (EUA). This EUA will remain in effect (meaning this test can be used) for the duration of the COVID-19 declaration under Section 564(b)(1) of the Act, 21 U.S.C. section 360bbb-3(b)(1), unless the authorization is terminated or revoked.  Performed at [facility], [address] [place], [place] [address]  Labs: CBC: Recent Labs  Lab 09/28/23 1230 09/29/23 0506 09/30/23 0422 10/01/23 0406 10/02/23 0419  WBC 7.3 5.8 7.6 7.6 6.8  NEUTROABS 5.4  --   --   --   --   HGB 13.5 13.7 14.0 14.7 15.0  HCT 41.6 40.3 40.7 42.5 43.3  MCV 105.1* 102.0* 100.5* 99.3 99.3  PLT 229 234 251 253 247   Basic Metabolic Panel: Recent Labs  Lab 09/28/23 1230 09/29/23 0506 09/30/23 0422 10/01/23 0406 10/02/23 0419  NA 138 139 138 136 137  K 4.1 3.2* 4.0 3.5 3.3*  CL 106 102 102 98 100  CO2 23 27 26 23 26   GLUCOSE 123* 93 84 80 93  BUN 15 16 18 20 21   CREATININE 1.07* 1.04* 1.03* 1.15* 1.01*  CALCIUM  8.9 8.7* 8.9 8.9 8.9  MG 2.2  --   --  2.4  --   PHOS 3.7  --   --   --   --    Liver Function Tests: Recent Labs  Lab 09/28/23 1230  AST 35  ALT 28  ALKPHOS 66  BILITOT 1.9*  PROT 6.8  ALBUMIN 3.8   CBG: Recent Labs  Lab 09/29/23 0749 09/30/23 0723 10/01/23 0719 10/02/23 0736  GLUCAP 88 89 108* 116*    Discharge time spent: greater than 30 minutes.  Signed: Elidia Toribio Furnace, MD [facility] 10/02/2023

## 2023-10-02 NOTE — TOC Transition Note (Incomplete)
 Transition of Care [facility]) - Discharge Note  Patient Details  Name: Bethany Villegas MRN: [id number] Date of Birth: Jun 08, 1934  [facility] CM/SW Contact:  Nena LITTIE Coffee, RN Phone Number: 10/02/2023, 12:43 PM  Clinical Narrative:    Pt will dc home c/[facility] & PT. Pt's son will provide transportation home.   Final next level of care: Home w Home Health Services Barriers to Discharge: Barriers Resolved  Patient Goals and CMS Choice Patient states their goals for this hospitalization and ongoing recovery are:: Return home   Discharge Services  HH Arranged: RN, PT [facility] Agency: [facility] Date [facility] Agency Contacted: 10/02/23   Representative spoke with at [facility] Agency: Darleene  Social Drivers of Health (SDOH) Interventions SDOH Screenings   Food Insecurity: No Food Insecurity (09/28/2023)  Housing: Low Risk  (09/28/2023)  Transportation Needs: No Transportation Needs (09/28/2023)  Utilities: Not At Risk (09/28/2023)  Social Connections: Moderately Isolated (09/28/2023)  Tobacco Use: Low Risk  (09/28/2023)   Readmission Risk Interventions    09/29/2023   11:22 AM  Readmission Risk Prevention Plan  Post Dischage Appt Complete  Medication Screening Complete  Transportation Screening Complete

## 2023-10-06 ENCOUNTER — Encounter (HOSPITAL_COMMUNITY)

## 2023-10-06 DIAGNOSIS — I5033 Acute on chronic diastolic (congestive) heart failure: Secondary | ICD-10-CM | POA: Diagnosis not present

## 2023-10-06 DIAGNOSIS — I088 Other rheumatic multiple valve diseases: Secondary | ICD-10-CM | POA: Diagnosis not present

## 2023-10-07 DIAGNOSIS — I10 Essential (primary) hypertension: Secondary | ICD-10-CM | POA: Diagnosis not present

## 2023-10-07 DIAGNOSIS — I429 Cardiomyopathy, unspecified: Secondary | ICD-10-CM | POA: Diagnosis not present

## 2023-10-07 DIAGNOSIS — Z299 Encounter for prophylactic measures, unspecified: Secondary | ICD-10-CM | POA: Diagnosis not present

## 2023-10-07 DIAGNOSIS — I4891 Unspecified atrial fibrillation: Secondary | ICD-10-CM | POA: Diagnosis not present

## 2023-10-07 DIAGNOSIS — I5033 Acute on chronic diastolic (congestive) heart failure: Secondary | ICD-10-CM | POA: Diagnosis not present

## 2023-10-07 DIAGNOSIS — I5022 Chronic systolic (congestive) heart failure: Secondary | ICD-10-CM | POA: Diagnosis not present

## 2023-10-07 DIAGNOSIS — Z09 Encounter for follow-up examination after completed treatment for conditions other than malignant neoplasm: Secondary | ICD-10-CM | POA: Diagnosis not present

## 2023-10-07 DIAGNOSIS — I088 Other rheumatic multiple valve diseases: Secondary | ICD-10-CM | POA: Diagnosis not present

## 2023-10-08 ENCOUNTER — Encounter (HOSPITAL_COMMUNITY)

## 2023-10-12 DIAGNOSIS — I5033 Acute on chronic diastolic (congestive) heart failure: Secondary | ICD-10-CM | POA: Diagnosis not present

## 2023-10-12 DIAGNOSIS — I088 Other rheumatic multiple valve diseases: Secondary | ICD-10-CM | POA: Diagnosis not present

## 2023-10-13 DIAGNOSIS — I088 Other rheumatic multiple valve diseases: Secondary | ICD-10-CM | POA: Diagnosis not present

## 2023-10-13 DIAGNOSIS — I5033 Acute on chronic diastolic (congestive) heart failure: Secondary | ICD-10-CM | POA: Diagnosis not present

## 2023-10-14 DIAGNOSIS — I088 Other rheumatic multiple valve diseases: Secondary | ICD-10-CM | POA: Diagnosis not present

## 2023-10-14 DIAGNOSIS — I5033 Acute on chronic diastolic (congestive) heart failure: Secondary | ICD-10-CM | POA: Diagnosis not present

## 2023-10-19 DIAGNOSIS — I5033 Acute on chronic diastolic (congestive) heart failure: Secondary | ICD-10-CM | POA: Diagnosis not present

## 2023-10-19 DIAGNOSIS — I088 Other rheumatic multiple valve diseases: Secondary | ICD-10-CM | POA: Diagnosis not present

## 2023-10-20 ENCOUNTER — Encounter: Payer: Self-pay | Admitting: Nurse Practitioner

## 2023-10-20 ENCOUNTER — Ambulatory Visit: Attending: Nurse Practitioner | Admitting: Nurse Practitioner

## 2023-10-20 VITALS — BP 110/60 | HR 126 | Ht 61.0 in | Wt 109.0 lb

## 2023-10-20 DIAGNOSIS — I872 Venous insufficiency (chronic) (peripheral): Secondary | ICD-10-CM

## 2023-10-20 DIAGNOSIS — I5022 Chronic systolic (congestive) heart failure: Secondary | ICD-10-CM | POA: Diagnosis not present

## 2023-10-20 DIAGNOSIS — I480 Paroxysmal atrial fibrillation: Secondary | ICD-10-CM | POA: Insufficient documentation

## 2023-10-20 DIAGNOSIS — I4891 Unspecified atrial fibrillation: Secondary | ICD-10-CM | POA: Diagnosis not present

## 2023-10-20 MED ORDER — SPIRONOLACTONE 25 MG PO TABS: 12.5000 mg | ORAL_TABLET | Freq: Every day | ORAL | 0 refills | Status: DC

## 2023-10-20 NOTE — Progress Notes (Addendum)
 Cardiology Office Note   Date:  10/20/2023 ID:  Bethany Villegas, DOB 16-Apr-1934, MRN [id number] PCP: Bethany [name], MD  [name] Cardiologist:  Bethany SHAUNNA Maywood, MD     History of Present Illness Bethany Villegas is a 89 y.o. female with a PMH of A-fib, Graves' disease, s/p RAI around 30 years ago, hypothyroidism, history of kidney stones, GERD, presents today for A-fib follow-up.  Diagnosed with new onset A-fib with RVR in her PCPs office in February 2025.  PCP started her on Toprol -XL 25 mg once daily and Eliquis  5 mg twice daily.  Echocardiogram revealed LVEF 50 to 55%, mild MR, mild diastolic dysfunction, aortic root dilatation at 38 cm.  Last seen by Dr. Mallipeddi on 07/02/2023.  She was doing well at the time, asymptomatic regarding her A-fib.  EKG revealed normal sinus rhythm in office.  Very active and independent, working at [employer].  Recent hospitalization due to acute on chronic diastolic CHF, presented with dyspnea.  Respiratory viral panel negative.  BNP 1839.  CXR showed cardiomegaly with bilateral hilar vascular congestion.  Was found to be in A-fib with RVR on arrival.  See echocardiogram noted below-LVEF 40 to 45%, moderate MR, moderate TR.  She received IV Lasix  for diuresis.  Was recommended follow-up outpatient.   Today she presents for hospital follow-up and A-fib follow-up.  She admits to feeling tired/fatigued and does not palpitations. Does admit that she has intermittent shortness of breath, sometimes has to stop what she is doing to catch her breath. Very active and independent at her age. Admits to what sounds like one skipped dose of Eliquis  within the past 3-4 weeks. Denies any chest pain, syncope, presyncope, dizziness, orthopnea, PND, swelling or significant weight changes, acute bleeding, or claudication.  ROS: Negative. See HPI.   Studies Reviewed  EKG:  EKG Interpretation Date/Time:  Wednesday October 20 2023 14:28:10  EDT Ventricular Rate:  126 PR Interval:    QRS Duration:  68 QT Interval:  288 QTC Calculation: 417 R Axis:   91  Text Interpretation: Atrial fibrillation with rapid ventricular response Rightward axis Nonspecific T wave abnormality When compared with ECG of 01-Oct-2023 05:21, Questionable change in QRS axis Nonspecific T wave abnormality now evident in Anterior leads Confirmed by Miriam Norris [phone]) on 10/20/2023 2:31:31 PM   Echo 09/2023:  1. Left ventricular ejection fraction, by estimation, is 40 to 45%. The  left ventricle has mildly decreased function. The left ventricle  demonstrates global hypokinesis. There is moderate concentric left  ventricular hypertrophy. Left ventricular  diastolic parameters are indeterminate.   2. Right ventricular systolic function is mildly reduced. The right  ventricular size is normal. There is normal pulmonary artery systolic  pressure. The estimated right ventricular systolic pressure is 29.7 mmHg.   3. Left pleural effusion noted.   4. The mitral valve is grossly normal. Moderate mitral valve  regurgitation.   5. Tricuspid valve regurgitation is moderate.   6. The aortic valve is tricuspid. There is mild calcification of the  aortic valve. Aortic valve regurgitation is trivial. Aortic valve  sclerosis is present, with no evidence of aortic valve stenosis.   7. The inferior vena cava is normal in size with <50% respiratory  variability, suggesting right atrial pressure of 8 mmHg.   Comparison(s): Prior images unable to be directly viewed.  Physical Exam VS:  BP 110/60   Pulse (!) 126   Ht 5' 1 (1.549 m)   Wt 109 lb (49.4  kg)   SpO2 96%   BMI 20.60 kg/m        Wt Readings from Last 3 Encounters:  10/20/23 109 lb (49.4 kg)  10/02/23 111 lb 14.4 oz (50.8 kg)  07/02/23 109 lb 3.2 oz (49.5 kg)    GEN: Thin, frail 89 y.o, female in no acute distress NECK: No JVD; No carotid bruits CARDIAC: S1/S2, fast rate and irregular rhythm, no  murmurs, rubs, gallops RESPIRATORY:  Clear to auscultation without rales, wheezing or rhonchi  ABDOMEN: Soft, non-tender, non-distended EXTREMITIES:  No edema, varicose veins to BLE with some skin discoloration; No deformity   ASSESSMENT AND PLAN  A-fib with RVR Heart rate is not well-controlled.  She is symptomatic with this and has most likely skipped at least 1 dose of Eliquis  within the past 3 to 4 weeks per her report.  Ran case past her cardiologist, Dr. Mallipeddi, in office, who agreed with cardioversion.  Due to skipping at least 1 dose of Eliquis , recommended TEE with cardioversion, however due to patient's relative contraindications for TEE, came to shared medical decision with patient that we will work on rate control and medication compliance prior to cardioversion.  Will increase metoprolol  to 50 mg in a.m., 25 mg in p.m. and bring her back in 1 week for repeat EKG.  Continue Eliquis  for stroke prevention.  She is on appropriate dosage given her age and weight.   2. HFmrEF Stage C, NYHA class II-III symptoms. EF 40-45% d/t A-fib with RVR, most likely NICM. Euvolemic and well compensated on exam.  Due to her soft BPs, will stop spironolactone  as we are increasing metoprolol , will begin potassium supplement, 20 mEq daily and repeat BMET, proBNP, and Mag in 1 week. Low sodium diet, fluid restriction <2L, and daily weights encouraged. Educated to contact our office for weight gain of 2 lbs overnight or 5 lbs in one week.  3. Chronic Venous Insufficiency Noted on exam, no significant edema noted. Discussed care precautions. Continue to follow with PCP.   Dispo: Care and ED precautions discussed. Follow-up with MD/APP in 4-6 weeks or sooner if anything changes.   Signed, Almarie Crate, NP

## 2023-10-20 NOTE — Patient Instructions (Addendum)
 Medication Instructions:  - Take Metoprolol  succinate 50 mg tablet in the AM, 25 mg tablet in the PM.  - Start Potassium Chloride  20 mEq daily. - Stop Spironolactone .  Your physician recommends that you continue the rest of your current medications as directed. Please refer to the Current Medication list given to you today.  Labwork: Labwork in 1 week  Testing/Procedures: None   Follow-Up: Your physician recommends that you schedule a follow-up appointment in:  1 week nurse visit for EKG  4 weeks   Any Other Special Instructions Will Be Listed Below (If Applicable).  If you need a refill on your cardiac medications before your next appointment, please call your pharmacy.

## 2023-10-21 DIAGNOSIS — I088 Other rheumatic multiple valve diseases: Secondary | ICD-10-CM | POA: Diagnosis not present

## 2023-10-21 DIAGNOSIS — I5033 Acute on chronic diastolic (congestive) heart failure: Secondary | ICD-10-CM | POA: Diagnosis not present

## 2023-10-22 DIAGNOSIS — I088 Other rheumatic multiple valve diseases: Secondary | ICD-10-CM | POA: Diagnosis not present

## 2023-10-22 DIAGNOSIS — I5033 Acute on chronic diastolic (congestive) heart failure: Secondary | ICD-10-CM | POA: Diagnosis not present

## 2023-10-25 ENCOUNTER — Telehealth: Payer: Self-pay | Admitting: Nurse Practitioner

## 2023-10-25 DIAGNOSIS — E039 Hypothyroidism, unspecified: Secondary | ICD-10-CM | POA: Diagnosis not present

## 2023-10-25 DIAGNOSIS — I5022 Chronic systolic (congestive) heart failure: Secondary | ICD-10-CM | POA: Diagnosis not present

## 2023-10-25 DIAGNOSIS — I5033 Acute on chronic diastolic (congestive) heart failure: Secondary | ICD-10-CM | POA: Diagnosis not present

## 2023-10-25 DIAGNOSIS — I088 Other rheumatic multiple valve diseases: Secondary | ICD-10-CM | POA: Diagnosis not present

## 2023-10-25 MED ORDER — METOPROLOL SUCCINATE ER 50 MG PO TB24: ORAL_TABLET | ORAL | 3 refills | Status: DC

## 2023-10-25 MED ORDER — POTASSIUM CHLORIDE CRYS ER 20 MEQ PO TBCR: 20.0000 meq | EXTENDED_RELEASE_TABLET | Freq: Every day | ORAL | 0 refills | Status: AC

## 2023-10-25 NOTE — Addendum Note (Signed)
 Addended by: Nobel Brar on: 10/25/2023 10:24 AM   Modules accepted: Orders

## 2023-10-25 NOTE — Progress Notes (Addendum)
 Addendum - 10/25/2023:   Upon further chart review, this NP did not see medication changes instructions at last visit on AVS. Discussed with CMA Alica Dec) and verified orders given during patient's most recent office visit. Clarified that cancelled order discussed with CMA from last visit included the cardioversion but to continue all other orders, and CMA stated she thought all other orders were cancelled by this NP. This has been clarified and chart updated. CMA verbalized understanding and is calling patient to update her and reflect changes to medications and lab work.   Almarie Crate, NP

## 2023-10-25 NOTE — Telephone Encounter (Signed)
 Called and s/w patient. Patient stated she has been feeling better compared to last office visit.  Tells me she has some good days and bad days.  Home health has been checking her heart rate over the past week and tells me that typically every time her heart rate is checked, she is told that it is racing away.  She cannot give me her heart rate readings specifically, does tell me her blood pressure is very well-controlled.   Called and made her aware of medication changes from last office visit.  She confirms to me that she is still taking spironolactone  and only taking metoprolol  succinate 50 mg daily.  I gave her instructions from last office visit and discussed with her to stop spironolactone  and she will increase metoprolol  succinate to 50 mg in the morning, 25 mg in the evening.  I also discussed with her that we will be starting a potassium chloride  20 mill equivalent tablet daily supplement and repeat BMET in 1 week.  She verbalized understanding of instructions and read this back to me completely correct.  She is due for EKG visit later this week.  Will keep follow-up with her next month.  Care and ED precautions discussed.  She verbalized understanding and was appreciative of my call.  Bethany Crate, NP

## 2023-10-27 DIAGNOSIS — I5033 Acute on chronic diastolic (congestive) heart failure: Secondary | ICD-10-CM | POA: Diagnosis not present

## 2023-10-27 DIAGNOSIS — I088 Other rheumatic multiple valve diseases: Secondary | ICD-10-CM | POA: Diagnosis not present

## 2023-10-28 ENCOUNTER — Ambulatory Visit: Attending: Cardiology

## 2023-10-28 ENCOUNTER — Telehealth: Payer: Self-pay

## 2023-10-28 DIAGNOSIS — I4891 Unspecified atrial fibrillation: Secondary | ICD-10-CM

## 2023-10-28 MED ORDER — METOPROLOL SUCCINATE ER 50 MG PO TB24: ORAL_TABLET | ORAL | 3 refills | Status: DC

## 2023-10-28 NOTE — Telephone Encounter (Signed)
-----   Message from Almarie Crate sent at 10/28/2023  2:08 PM EDT ----- Patient is in A-fib with heart rate improved from last office visit, but still slightly fast.  If her blood pressures have been stable, please increase her nightly metoprolol  to 50 mg in the evening.  Therefore, she should take 50 of metoprolol  succinate in the a.m., 50 mg of metoprolol  succinate in the p.m.  Please return in 1 week for repeat EKG. She should not be skipping any doses of Eliquis . Follow up as scheduled.  Thanks!    Best, Almarie Crate, NP ----- Message ----- From: Johnnye Littie HERO, CMA Sent: 10/28/2023   1:49 PM EDT To: Almarie Crate, NP

## 2023-10-28 NOTE — Progress Notes (Signed)
 Patient was here for an EKG

## 2023-10-28 NOTE — Telephone Encounter (Signed)
 Left message for patient to call the office.

## 2023-10-28 NOTE — Telephone Encounter (Signed)
 Notified patient of Elizabeth's recommendations. Updated her Metoprolol  and made her nurse visit appointment. No further issues and patient verbalized understanding

## 2023-11-01 DIAGNOSIS — I5033 Acute on chronic diastolic (congestive) heart failure: Secondary | ICD-10-CM | POA: Diagnosis not present

## 2023-11-01 DIAGNOSIS — I088 Other rheumatic multiple valve diseases: Secondary | ICD-10-CM | POA: Diagnosis not present

## 2023-11-04 ENCOUNTER — Emergency Department (HOSPITAL_COMMUNITY)

## 2023-11-04 ENCOUNTER — Telehealth: Payer: Self-pay | Admitting: Nurse Practitioner

## 2023-11-04 ENCOUNTER — Inpatient Hospital Stay (HOSPITAL_COMMUNITY)
Admission: EM | Admit: 2023-11-04 | Discharge: 2023-11-06 | DRG: 308 | Disposition: A | Discharge status: Home / Self Care | Source: Ambulatory Visit | Attending: Family Medicine | Admitting: Family Medicine

## 2023-11-04 ENCOUNTER — Ambulatory Visit

## 2023-11-04 ENCOUNTER — Other Ambulatory Visit: Payer: Self-pay

## 2023-11-04 ENCOUNTER — Encounter (HOSPITAL_COMMUNITY): Payer: Self-pay

## 2023-11-04 DIAGNOSIS — Z882 Allergy status to sulfonamides status: Secondary | ICD-10-CM | POA: Diagnosis not present

## 2023-11-04 DIAGNOSIS — I480 Paroxysmal atrial fibrillation: Secondary | ICD-10-CM | POA: Diagnosis not present

## 2023-11-04 DIAGNOSIS — Z79890 Hormone replacement therapy (postmenopausal): Secondary | ICD-10-CM

## 2023-11-04 DIAGNOSIS — Z7984 Long term (current) use of oral hypoglycemic drugs: Secondary | ICD-10-CM | POA: Diagnosis not present

## 2023-11-04 DIAGNOSIS — R0602 Shortness of breath: Secondary | ICD-10-CM | POA: Diagnosis not present

## 2023-11-04 DIAGNOSIS — E0500 Thyrotoxicosis with diffuse goiter without thyrotoxic crisis or storm: Secondary | ICD-10-CM | POA: Diagnosis present

## 2023-11-04 DIAGNOSIS — K219 Gastro-esophageal reflux disease without esophagitis: Secondary | ICD-10-CM | POA: Diagnosis present

## 2023-11-04 DIAGNOSIS — E039 Hypothyroidism, unspecified: Secondary | ICD-10-CM | POA: Diagnosis present

## 2023-11-04 DIAGNOSIS — J811 Chronic pulmonary edema: Secondary | ICD-10-CM | POA: Diagnosis not present

## 2023-11-04 DIAGNOSIS — Z90710 Acquired absence of both cervix and uterus: Secondary | ICD-10-CM

## 2023-11-04 DIAGNOSIS — Z880 Allergy status to penicillin: Secondary | ICD-10-CM

## 2023-11-04 DIAGNOSIS — I4819 Other persistent atrial fibrillation: Principal | ICD-10-CM | POA: Diagnosis present

## 2023-11-04 DIAGNOSIS — I4891 Unspecified atrial fibrillation: Secondary | ICD-10-CM | POA: Diagnosis not present

## 2023-11-04 DIAGNOSIS — Z7901 Long term (current) use of anticoagulants: Secondary | ICD-10-CM

## 2023-11-04 DIAGNOSIS — I429 Cardiomyopathy, unspecified: Secondary | ICD-10-CM | POA: Diagnosis present

## 2023-11-04 DIAGNOSIS — J90 Pleural effusion, not elsewhere classified: Secondary | ICD-10-CM | POA: Diagnosis not present

## 2023-11-04 DIAGNOSIS — I5023 Acute on chronic systolic (congestive) heart failure: Secondary | ICD-10-CM | POA: Diagnosis not present

## 2023-11-04 DIAGNOSIS — I110 Hypertensive heart disease with heart failure: Secondary | ICD-10-CM | POA: Diagnosis not present

## 2023-11-04 DIAGNOSIS — I509 Heart failure, unspecified: Secondary | ICD-10-CM

## 2023-11-04 LAB — TROPONIN I (HIGH SENSITIVITY)
Troponin I (High Sensitivity): 16 ng/L (ref <18)
Troponin I (High Sensitivity): 17 ng/L (ref <18)

## 2023-11-04 LAB — COMPREHENSIVE METABOLIC PANEL WITH GFR
ALT: 58 U/L — ABNORMAL HIGH (ref 0–44)
AST: 61 U/L — ABNORMAL HIGH (ref 15–41)
Albumin: 3.8 g/dL (ref 3.5–5.0)
Alkaline Phosphatase: 76 U/L (ref 38–126)
Anion gap: 11 (ref 5–15)
BUN: 18 mg/dL (ref 8–23)
CO2: 22 mmol/L (ref 22–32)
Calcium: 8.9 mg/dL (ref 8.9–10.3)
Chloride: 107 mmol/L (ref 98–111)
Creatinine, Ser: 1.06 mg/dL — ABNORMAL HIGH (ref 0.44–1.00)
GFR, Estimated: 51 mL/min — ABNORMAL LOW (ref >60)
Glucose, Bld: 120 mg/dL — ABNORMAL HIGH (ref 70–99)
Potassium: 4 mmol/L (ref 3.5–5.1)
Sodium: 140 mmol/L (ref 135–145)
Total Bilirubin: 2.7 mg/dL — ABNORMAL HIGH (ref 0.0–1.2)
Total Protein: 6.8 g/dL (ref 6.5–8.1)

## 2023-11-04 LAB — CBC WITH DIFFERENTIAL/PLATELET
Abs Immature Granulocytes: 0.03 K/uL (ref 0.00–0.07)
Basophils Absolute: 0.1 K/uL (ref 0.0–0.1)
Basophils Relative: 1 %
Eosinophils Absolute: 0.2 K/uL (ref 0.0–0.5)
Eosinophils Relative: 3 %
HCT: 43.4 % (ref 36.0–46.0)
Hemoglobin: 14.3 g/dL (ref 12.0–15.0)
Immature Granulocytes: 0 %
Lymphocytes Relative: 24 %
Lymphs Abs: 1.8 K/uL (ref 0.7–4.0)
MCH: 34.4 pg — ABNORMAL HIGH (ref 26.0–34.0)
MCHC: 32.9 g/dL (ref 30.0–36.0)
MCV: 104.3 fL — ABNORMAL HIGH (ref 80.0–100.0)
Monocytes Absolute: 0.6 K/uL (ref 0.1–1.0)
Monocytes Relative: 8 %
Neutro Abs: 4.7 K/uL (ref 1.7–7.7)
Neutrophils Relative %: 64 %
Platelets: 229 K/uL (ref 150–400)
RBC: 4.16 MIL/uL (ref 3.87–5.11)
RDW: 14.2 % (ref 11.5–15.5)
WBC: 7.4 K/uL (ref 4.0–10.5)
nRBC: 0 % (ref 0.0–0.2)

## 2023-11-04 LAB — BRAIN NATRIURETIC PEPTIDE: B Natriuretic Peptide: 3127 pg/mL — ABNORMAL HIGH (ref 0.0–100.0)

## 2023-11-04 MED ORDER — METOPROLOL TARTRATE 5 MG/5ML IV SOLN
5.0000 mg | Freq: Once | INTRAVENOUS | Status: AC
  Administered 2023-11-04: 5 mg via INTRAVENOUS
  Filled 2023-11-04: qty 5

## 2023-11-04 MED ORDER — POLYETHYLENE GLYCOL 3350 17 G PO PACK: 17.0000 g | PACK | Freq: Every day | ORAL | Status: DC | PRN

## 2023-11-04 MED ORDER — LEVOTHYROXINE SODIUM 75 MCG PO TABS
75.0000 ug | ORAL_TABLET | Freq: Every day | ORAL | Status: DC
  Administered 2023-11-05 – 2023-11-06 (×2): 75 ug via ORAL
  Filled 2023-11-04 (×2): qty 1

## 2023-11-04 MED ORDER — ACETAMINOPHEN 650 MG RE SUPP: 650.0000 mg | Freq: Four times a day (QID) | RECTAL | Status: DC | PRN

## 2023-11-04 MED ORDER — ACETAMINOPHEN 325 MG PO TABS: 650.0000 mg | ORAL_TABLET | Freq: Four times a day (QID) | ORAL | Status: DC | PRN

## 2023-11-04 MED ORDER — ONDANSETRON HCL 4 MG PO TABS: 4.0000 mg | ORAL_TABLET | Freq: Four times a day (QID) | ORAL | Status: DC | PRN

## 2023-11-04 MED ORDER — FUROSEMIDE 10 MG/ML IJ SOLN
40.0000 mg | INTRAMUSCULAR | Status: AC
  Administered 2023-11-04: 40 mg via INTRAVENOUS
  Filled 2023-11-04: qty 4

## 2023-11-04 MED ORDER — APIXABAN 2.5 MG PO TABS
2.5000 mg | ORAL_TABLET | Freq: Two times a day (BID) | ORAL | Status: DC
  Administered 2023-11-04 – 2023-11-06 (×4): 2.5 mg via ORAL
  Filled 2023-11-04 (×4): qty 1

## 2023-11-04 MED ORDER — METOPROLOL TARTRATE 5 MG/5ML IV SOLN: 5.0000 mg | INTRAVENOUS | Status: DC | PRN

## 2023-11-04 MED ORDER — METOPROLOL SUCCINATE ER 50 MG PO TB24
50.0000 mg | ORAL_TABLET | Freq: Two times a day (BID) | ORAL | Status: DC
  Administered 2023-11-05 – 2023-11-06 (×3): 50 mg via ORAL
  Filled 2023-11-04 (×4): qty 1

## 2023-11-04 MED ORDER — ONDANSETRON HCL 4 MG/2ML IJ SOLN: 4.0000 mg | Freq: Four times a day (QID) | INTRAMUSCULAR | Status: DC | PRN

## 2023-11-04 MED ORDER — FUROSEMIDE 10 MG/ML IJ SOLN
40.0000 mg | Freq: Once | INTRAMUSCULAR | Status: AC
  Filled 2023-11-04: qty 4

## 2023-11-04 NOTE — Plan of Care (Signed)

## 2023-11-04 NOTE — ED Triage Notes (Signed)
 Pt arrived via POV c/o worsening SOB, 5lb weight gain and reports fluid retention. Pt reports symptoms started getting worse yesterday.

## 2023-11-04 NOTE — ED Provider Notes (Signed)
 [place] EMERGENCY DEPARTMENT AT [facility] Provider Note   CSN: [id number] Arrival date & time: 11/04/23  1351     Patient presents with: Shortness of Breath   Bethany Villegas is a 89 y.o. female.    Shortness of Breath  This patient is an 89 year old female, she has a history of recently being admitted to the hospital, she was diagnosed with acute systolic congestive heart failure and had an echocardiogram showing an ejection fraction of 40 to 45%.  She had moderate mitral and moderate tricuspid regurgitation.  The patient reports to me that she has a history of new onset A-fib in February 2025, she was started on Toprol  and Eliquis , her echocardiogram was 50 to 55% at that time.  Interestingly the patient was in rapid atrial fibrillation when she was in the office on September 3 approximately 2 weeks ago, they were considering cardioversion but were going to increase metoprolol  to 50 mg in the morning 20 5 in the afternoon, continue anticoagulants, they stop spironolactone  at that time  The patient reports that she has had about a 5 pound weight gain and is now sleeping on the couch propped up on pillows because she gets short of breath when she lays flat, short of breath when she exerts herself, she has had only 1 dose of furosemide  in the last couple of weeks and was told that she only needed to take it if she was gaining weight.    Prior to Admission medications   Medication Sig Start Date End Date Taking? Authorizing Provider  apixaban  (ELIQUIS ) 2.5 MG TABS tablet Take 1 tablet (2.5 mg total) by mouth 2 (two) times daily. 07/02/23  Yes Mallipeddi, Vishnu P, MD  empagliflozin  (JARDIANCE ) 10 MG TABS tablet Take 1 tablet (10 mg total) by mouth daily. 10/02/23  Yes Arrien, Elidia Sieving, MD  furosemide  (LASIX ) 20 MG tablet Take 1 tablet (20 mg total) by mouth daily as needed for edema or fluid (wieght gain 2 to 3 lbs in 24 hrs or 5 lbs in 7 days). 10/02/23  Yes Arrien,  Mauricio Daniel, MD  metoprolol  succinate (TOPROL -XL) 50 MG 24 hr tablet Take Metoprolol  succinate 50 mg tablet in the AM, 50 mg tablet in the PM. Patient taking differently: Take 50 mg by mouth in the morning and at bedtime. Take Metoprolol  succinate 50 mg tablet in the AM, 50 mg tablet in the PM. 10/28/23  Yes Miriam Norris, NP  multivitamin-iron-minerals-folic acid (CENTRUM) chewable tablet Chew 1 tablet by mouth daily.   Yes [provider]  potassium chloride  SA (KLOR-CON  M) 20 MEQ tablet Take 1 tablet (20 mEq total) by mouth daily. 10/25/23  Yes Miriam Norris, NP  SYNTHROID  75 MCG tablet Take 75 mcg by mouth daily. 10/27/23  Yes [provider]  vitamin B-12 (CYANOCOBALAMIN ) 250 MCG tablet Take 250 mcg by mouth daily.   Yes [provider]    Allergies: Penicillins and Sulfa antibiotics    Review of Systems  Respiratory:  Positive for shortness of breath.   All other systems reviewed and are negative.   Updated Vital Signs BP 108/67   Pulse 98   Temp (!) 97.5 F (36.4 C) (Temporal)   Resp (!) 22   Ht 1.549 m (5' 1)   Wt 51.7 kg   SpO2 95%   BMI 21.54 kg/m   Physical Exam Vitals and nursing note reviewed.  Constitutional:      Appearance: She is well-developed.  Comments: tachypneic  HENT:     Head: Normocephalic and atraumatic.     Mouth/Throat:     Pharynx: No oropharyngeal exudate.  Eyes:     General: No scleral icterus.       Right eye: No discharge.        Left eye: No discharge.     Conjunctiva/sclera: Conjunctivae normal.     Pupils: Pupils are equal, round, and reactive to light.  Neck:     Thyroid : No thyromegaly.     Vascular: No JVD.  Cardiovascular:     Rate and Rhythm: Tachycardia present. Rhythm irregular.     Heart sounds: Murmur heard.     No friction rub. No gallop.  Pulmonary:     Effort: Respiratory distress present.     Breath sounds: Rales present. No wheezing.     Comments: Bilateral rales at  bases Abdominal:     General: Bowel sounds are normal. There is no distension.     Palpations: Abdomen is soft. There is no mass.     Tenderness: There is no abdominal tenderness.  Musculoskeletal:        General: No tenderness. Normal range of motion.     Cervical back: Normal range of motion and neck supple.     Right lower leg: Edema present.     Left lower leg: Edema present.     Comments: 1+ symmetrical edema bila LEs  Lymphadenopathy:     Cervical: No cervical adenopathy.  Skin:    General: Skin is warm and dry.     Findings: No erythema or rash.  Neurological:     General: No focal deficit present.     Mental Status: She is alert.     Coordination: Coordination normal.  Psychiatric:        Behavior: Behavior normal.     (all labs ordered are listed, but only abnormal results are displayed) Labs Reviewed  CBC WITH DIFFERENTIAL/PLATELET - Abnormal; Notable for the following components:      Result Value   MCV 104.3 (*)    MCH 34.4 (*)    All other components within normal limits  COMPREHENSIVE METABOLIC PANEL WITH GFR - Abnormal; Notable for the following components:   Glucose, Bld 120 (*)    Creatinine, Ser 1.06 (*)    AST 61 (*)    ALT 58 (*)    Total Bilirubin 2.7 (*)    GFR, Estimated 51 (*)    All other components within normal limits  BRAIN NATRIURETIC PEPTIDE  TROPONIN I (HIGH SENSITIVITY)    EKG: EKG Interpretation Date/Time:  Thursday November 04 2023 14:18:25 EDT Ventricular Rate:  112 PR Interval:    QRS Duration:  79 QT Interval:  302 QTC Calculation: 413 R Axis:   74  Text Interpretation: that is something i would strongly recommend that you use a smart Atrial fibrillation Ventricular premature complex Borderline repolarization abnormality  you made any smart phrases yet Confirmed by Cleotilde Rogue ([id number]) on 11/04/2023 2:28:18 PM  Radiology: [facility] Chest Port 1 View Result Date: 11/04/2023 EXAM: 1 VIEW XRAY OF THE CHEST 11/04/2023 02:28:00 PM  COMPARISON: 10/01/2023 CLINICAL HISTORY: SOB, CHF. Per triage; Pt arrived via POV c/o worsening SOB, 5lb weight gain and reports fluid retention. Pt reports symptoms started getting worse yesterday. FINDINGS: LUNGS AND PLEURA: Mild pulmonary edema. Interstitial coarsening. Trace right pleural effusion. HEART AND MEDIASTINUM: Stable cardiomegaly. Aortic atherosclerotic calcification. BONES AND SOFT TISSUES: No acute osseous abnormality. IMPRESSION: 1. Mild pulmonary  edema with interstitial coarsening. 2. Trace right pleural effusion. 3. Stable cardiomegaly. 4. Aortic atherosclerotic calcification. Electronically signed by: Waddell Calk MD 11/04/2023 02:45 PM EDT RP Workstation: [id number]     .Critical Care  Performed by: Cleotilde Rogue, MD Authorized by: Cleotilde Rogue, MD   Critical care provider statement:    Critical care time (minutes):  45   Critical care time was exclusive of:  Separately billable procedures and treating other patients and teaching time   Critical care was necessary to treat or prevent imminent or life-threatening deterioration of the following conditions:  Cardiac failure   Critical care was time spent personally by me on the following activities:  Development of treatment plan with patient or surrogate, discussions with consultants, evaluation of patient's response to treatment, examination of patient, obtaining history from patient or surrogate, review of old charts, re-evaluation of patient's condition, pulse oximetry, ordering and review of radiographic studies, ordering and review of laboratory studies and ordering and performing treatments and interventions   I assumed direction of critical care for this patient from another provider in my specialty: no     Care discussed with: admitting provider   Comments:          Medications Ordered in the ED  furosemide  (LASIX ) injection 40 mg (has no administration in time range)  metoprolol  tartrate (LOPRESSOR ) injection 5 mg  (5 mg Intravenous Given 11/04/23 1443)                                    Medical Decision Making Amount and/or Complexity of Data Reviewed Labs: ordered. Radiology: ordered.  Risk Prescription drug management. Decision regarding hospitalization.    This patient presents to the ED for concern of shortness of breath and weight gain, this involves an extensive number of treatment options, and is a complaint that carries with it a high risk of complications and morbidity.  The differential diagnosis includes congestive heart failure, pneumonia, pneumothorax, unlikely to be ischemia   Co morbidities / Chronic conditions that complicate the patient evaluation  Known congestive heart failure, rapid A-fib   Additional history obtained:  Additional history obtained from EMR External records from outside source obtained and reviewed including medical record including cardiology notes and recent admission   Lab Tests:  I Ordered, and personally interpreted labs.  The pertinent results include:   CBC without anemia or leukocytosis, metabolic panel is unremarkable, LFTs are only slightly elevated, troponin normal, BNP pending at the time of admission   Imaging Studies ordered:  I ordered imaging studies including chest x-ray I independently visualized and interpreted imaging which showed interstitial edema I agree with the radiologist interpretation   Cardiac Monitoring: / EKG:  The patient was maintained on a cardiac monitor.  I personally viewed and interpreted the cardiac monitored which showed an underlying rhythm of: A-fib   Problem List / ED Course / Critical interventions / Medication management  Patient required multiple interventions including Lasix  to diurese due to her orthopnea, required metoprolol  to help slow the rate down I ordered medication including metoprolol , Lasix  Reevaluation of the patient after these medicines showed that the patient improved I have  reviewed the patients home medicines and have made adjustments as needed   Consultations Obtained:  I requested consultation with the hospitalist Dr. Pearlean and cardiologist Dr. Alvan,  and discussed lab and imaging findings as well as pertinent plan - they recommend: Admission, cardiology  will see patient in consultation   Social Determinants of Health:  A-fib   Test / Admission - Considered:  Admit to high level of care      Final diagnoses:  Persistent atrial fibrillation (HCC)  Acute on chronic congestive heart failure, unspecified heart failure type [facility])    ED Discharge Orders     None          Cleotilde Rogue, MD 11/04/23 1524

## 2023-11-04 NOTE — Plan of Care (Signed)
  Problem: Education: Goal: Knowledge of General Education information will improve Description: Including pain rating scale, medication(s)/side effects and non-pharmacologic comfort measures 11/04/2023 1738 by Delores Kirsch, RN Outcome: Progressing 11/04/2023 1628 by Delores Kirsch, RN Outcome: Progressing   Problem: Health Behavior/Discharge Planning: Goal: Ability to manage health-related needs will improve 11/04/2023 1738 by Delores Kirsch, RN Outcome: Progressing 11/04/2023 1628 by Delores Kirsch, RN Outcome: Progressing   Problem: Clinical Measurements: Goal: Ability to maintain clinical measurements within normal limits will improve 11/04/2023 1738 by Delores Kirsch, RN Outcome: Progressing 11/04/2023 1628 by Delores Kirsch, RN Outcome: Progressing Goal: Will remain free from infection Outcome: Progressing

## 2023-11-04 NOTE — Assessment & Plan Note (Addendum)
 Currently in atrial fibrillation.  Heart rate 98-111.  She reports compliance with metoprolol  and Eliquis .  5 mg IV metoprolol  given in ED. -Resume both meds - IV metoprolol  5 mg for heart rate greater than 120

## 2023-11-04 NOTE — H&P (Signed)
 History and Physical    Bethany Villegas FMW:[id number] DOB: [birth year] DOA: 11/04/2023  PCP: Rosamond [name], MD  Patient coming from: Home  I have personally briefly reviewed patient's old medical records in [facility]  Chief Complaint: Difficulty breathing  HPI: Bethany Villegas is a 89 y.o. female with medical history significant for atrial fibrillation, congestive heart. Patient presented to the ED with complaints of difficulty breathing with minimal exertion, abdominal bloating and weight gain.  Symptoms have been ongoing for a while, but significantly worsened yesterday.  She has had to sleep sitting up.  She is on Lasix  as needed, she has taken just 1 dose- 2 weeks ago, since she was discharged from the hospital.  At the time she had difficulty breathing and weight gain, and the Lasix  helped.  She reports some mild left-sided chest pain yesterday that resolved spontaneously, she was resting when it started.  No lower extremity swelling.  She has been compliant with Eliquis .  She went to her cardiologist clinic today, she was told she had gained 5 pounds, she was referred to the ED.  Last hospitalization 8/12 to 8/16 for acute CHF exacerbation.  BNP was 1277.  Echo showed EF of 40 to 45%, with moderate tricuspid and mitral regurgitation  ED Course: Temperature 97.5.  Heart rate 98-111.  Respiratory rate 20-22.  Blood pressure systolic 108-129.  O2 sats 95% on room air. BNP elevated at 3127. Troponin 16. Chest x-ray showed mild pulmonary edema with interstitial coarsening, trace right pleural effusion. IV Lasix  40 mg x 1.  Metoprolol  5 mg x 1 given. EDP talked to cardiologist Dr. Alvan, will see in consult.  Review of Systems: As per HPI all other systems reviewed and negative.  Past Medical History:  Diagnosis Date   A-fib [facility])    GERD (gastroesophageal reflux disease)    Graves disease    Hypothyroid    Kidney stones     Past Surgical History:  Procedure Laterality  Date   ENDOVENOUS ABLATION SAPHENOUS VEIN W/ LASER Left 11/08/2019   endovenous laser ablation L GSV and stab phlebectomy 10-20 incisions left leg by Carlin Haddock MD    ESOPHAGOGASTRODUODENOSCOPY N/A 08/23/2015   Procedure: ESOPHAGOGASTRODUODENOSCOPY (EGD);  Surgeon: Claudis RAYMOND Rivet, MD;  Location: [facility];  Service: Endoscopy;  Laterality: N/A;  235-moved to 220 Ann to notify pt   TOTAL ABDOMINAL HYSTERECTOMY       reports that she has never smoked. She has never been exposed to tobacco smoke. She has never used smokeless tobacco. She reports that she does not drink alcohol  and does not use drugs.  Allergies  Allergen Reactions   Penicillins Swelling and Rash    Arm swelled   Sulfa Antibiotics Rash    Prior to Admission medications   Medication Sig Start Date End Date Taking? Authorizing Provider  apixaban  (ELIQUIS ) 2.5 MG TABS tablet Take 1 tablet (2.5 mg total) by mouth 2 (two) times daily. 07/02/23  Yes Mallipeddi, Vishnu P, MD  empagliflozin  (JARDIANCE ) 10 MG TABS tablet Take 1 tablet (10 mg total) by mouth daily. 10/02/23  Yes Arrien, Elidia Sieving, MD  furosemide  (LASIX ) 20 MG tablet Take 1 tablet (20 mg total) by mouth daily as needed for edema or fluid (wieght gain 2 to 3 lbs in 24 hrs or 5 lbs in 7 days). 10/02/23  Yes Arrien, Mauricio Daniel, MD  metoprolol  succinate (TOPROL -XL) 50 MG 24 hr tablet Take Metoprolol  succinate 50 mg tablet in the AM,  50 mg tablet in the PM. Patient taking differently: Take 50 mg by mouth in the morning and at bedtime. Take Metoprolol  succinate 50 mg tablet in the AM, 50 mg tablet in the PM. 10/28/23  Yes Miriam Norris, NP  multivitamin-iron-minerals-folic acid (CENTRUM) chewable tablet Chew 1 tablet by mouth daily.   Yes [provider]  potassium chloride  SA (KLOR-CON  M) 20 MEQ tablet Take 1 tablet (20 mEq total) by mouth daily. 10/25/23  Yes Miriam Norris, NP  SYNTHROID  75 MCG tablet Take 75 mcg by mouth daily. 10/27/23  Yes [provider]  vitamin B-12 (CYANOCOBALAMIN ) 250 MCG tablet Take 250 mcg by mouth daily.   Yes [provider]    Physical Exam: Vitals:   11/04/23 1359 11/04/23 1400 11/04/23 1445  BP: (!) 129/102  108/67  Pulse: (!) 111  98  Resp: 20  (!) 22  Temp: (!) 97.5 F (36.4 C)    TempSrc: Temporal    SpO2: 95%  95%  Weight:  51.7 kg   Height:  5' 1 (1.549 m)     Constitutional: NAD, calm, comfortable Vitals:   11/04/23 1359 11/04/23 1400 11/04/23 1445  BP: (!) 129/102  108/67  Pulse: (!) 111  98  Resp: 20  (!) 22  Temp: (!) 97.5 F (36.4 C)    TempSrc: Temporal    SpO2: 95%  95%  Weight:  51.7 kg   Height:  5' 1 (1.549 m)    Eyes: PERRL, lids and conjunctivae normal ENMT: Mucous membranes are moist.  Neck: normal, supple, no masses, no thyromegaly Respiratory: Normal respiratory effort. No accessory muscle use.  Cardiovascular: Regular rate and rhythm, No extremity edema.  Remedies warm Abdomen: no tenderness, soft , mildly distended, no masses palpated. No hepatosplenomegaly.  Musculoskeletal: no clubbing / cyanosis. No joint deformity upper and lower extremities.  Skin: no rashes, lesions, ulcers. No induration Neurologic: No facial asymmetry, moving extremities spontaneously, speech fluent. Psychiatric: Normal judgment and insight. Alert and oriented x 3. Normal mood.   Labs on Admission: I have personally reviewed following labs and imaging studies  CBC: Recent Labs  Lab 11/04/23 1431  WBC 7.4  NEUTROABS 4.7  HGB 14.3  HCT 43.4  MCV 104.3*  PLT 229   Basic Metabolic Panel: Recent Labs  Lab 11/04/23 1431  NA 140  K 4.0  CL 107  CO2 22  GLUCOSE 120*  BUN 18  CREATININE 1.06*  CALCIUM  8.9   GFR: Estimated Creatinine Clearance: 28.2 mL/min (A) (by C-G formula based on SCr of 1.06 mg/dL (H)). Liver Function Tests: Recent Labs  Lab 11/04/23 1431  AST 61*  ALT 58*  ALKPHOS 76  BILITOT 2.7*  PROT 6.8  ALBUMIN 3.8   Radiological Exams  on Admission: DG Chest Port 1 View Result Date: 11/04/2023 EXAM: 1 VIEW XRAY OF THE CHEST 11/04/2023 02:28:00 PM COMPARISON: 10/01/2023 CLINICAL HISTORY: SOB, CHF. Per triage; Pt arrived via POV c/o worsening SOB, 5lb weight gain and reports fluid retention. Pt reports symptoms started getting worse yesterday. FINDINGS: LUNGS AND PLEURA: Mild pulmonary edema. Interstitial coarsening. Trace right pleural effusion. HEART AND MEDIASTINUM: Stable cardiomegaly. Aortic atherosclerotic calcification. BONES AND SOFT TISSUES: No acute osseous abnormality. IMPRESSION: 1. Mild pulmonary edema with interstitial coarsening. 2. Trace right pleural effusion. 3. Stable cardiomegaly. 4. Aortic atherosclerotic calcification. Electronically signed by: Waddell Calk MD 11/04/2023 02:45 PM EDT RP Workstation: [id number]   EKG: Independently reviewed.  Atrial fibrillation, rate 112.  QTc 413.  No significant  change from prior.  Assessment/Plan Principal Problem:   Acute on chronic systolic (congestive) heart failure (HCC) Active Problems:   Current use of long term anticoagulation   Paroxysmal atrial fibrillation (HCC)   Graves disease  Assessment and Plan: * Acute on chronic systolic (congestive) heart failure (HCC) Dyspnea on exertion, orthopnea, abdominal bloating, 5lb weight gain.  No lower extremity swelling.  BNP elevated at 3127.  Chest x-ray with mild pulmonary edema.  Not hypoxic.  Last echo 09/29/2023 EF of 40 to 45%, moderate mitral and tricuspid regurgitation.  On Lasix  20 mg as needed.  Only took 1 dose in the past 2 weeks. -IV Lasix  40 mg x 2, further dosing pending clinical response - Strict input output, daily weight, daily BMP -Trend troponin -Resume metoprolol   Paroxysmal atrial fibrillation (HCC) Currently in atrial fibrillation.  Heart rate 98-111.  She reports compliance with metoprolol  and Eliquis .  5 mg IV metoprolol  given in ED. -Resume both meds - IV metoprolol  5 mg for heart rate greater  than 120  Graves disease Resume Synthroid    DVT prophylaxis: Eliquis  Code Status: FULL code-Confirmed with patient at bedside Family Communication: None at bedside Disposition Plan: ~ 2 days Consults called: Cardiology Admission status: Obs Tele    Author: Tully FORBES Carwin, MD 11/04/2023 4:31 PM  For on call review [email or web address].

## 2023-11-04 NOTE — Assessment & Plan Note (Signed)
 Resume Synthroid 

## 2023-11-04 NOTE — Assessment & Plan Note (Signed)
 Dyspnea on exertion, orthopnea, abdominal bloating, 5lb weight gain.  No lower extremity swelling.  BNP elevated at 3127.  Chest x-ray with mild pulmonary edema.  Not hypoxic.  Last echo 09/29/2023 EF of 40 to 45%, moderate mitral and tricuspid regurgitation.  On Lasix  20 mg as needed.  Only took 1 dose in the past 2 weeks. -IV Lasix  40 mg x 2, further dosing pending clinical response - Strict input output, daily weight, daily BMP -Trend troponin -Resume metoprolol 

## 2023-11-04 NOTE — Progress Notes (Signed)
 Patient is here today for an EKG visit that shows A-fib with RVR.  CMA came to my office today to have me take a look at this she also says that patient is having shortness of breath quite significantly (saw PCP earlier today), NYHA class III symptoms from what it sounds like.  Also having quite a bit of chest congestion/volume overload.  Based on these findings, I recommend she proceed to the ED.  Almarie Crate, NP

## 2023-11-04 NOTE — Telephone Encounter (Signed)
 Patient informed and verbalized understanding of plan.

## 2023-11-05 DIAGNOSIS — I4819 Other persistent atrial fibrillation: Secondary | ICD-10-CM | POA: Diagnosis present

## 2023-11-05 DIAGNOSIS — Z7984 Long term (current) use of oral hypoglycemic drugs: Secondary | ICD-10-CM | POA: Diagnosis not present

## 2023-11-05 DIAGNOSIS — Z880 Allergy status to penicillin: Secondary | ICD-10-CM | POA: Diagnosis not present

## 2023-11-05 DIAGNOSIS — I429 Cardiomyopathy, unspecified: Secondary | ICD-10-CM | POA: Diagnosis present

## 2023-11-05 DIAGNOSIS — I5023 Acute on chronic systolic (congestive) heart failure: Secondary | ICD-10-CM | POA: Diagnosis not present

## 2023-11-05 DIAGNOSIS — K219 Gastro-esophageal reflux disease without esophagitis: Secondary | ICD-10-CM | POA: Diagnosis present

## 2023-11-05 DIAGNOSIS — Z79890 Hormone replacement therapy (postmenopausal): Secondary | ICD-10-CM | POA: Diagnosis not present

## 2023-11-05 DIAGNOSIS — E039 Hypothyroidism, unspecified: Secondary | ICD-10-CM | POA: Diagnosis present

## 2023-11-05 DIAGNOSIS — Z882 Allergy status to sulfonamides status: Secondary | ICD-10-CM | POA: Diagnosis not present

## 2023-11-05 DIAGNOSIS — E0500 Thyrotoxicosis with diffuse goiter without thyrotoxic crisis or storm: Secondary | ICD-10-CM | POA: Diagnosis present

## 2023-11-05 DIAGNOSIS — Z90710 Acquired absence of both cervix and uterus: Secondary | ICD-10-CM | POA: Diagnosis not present

## 2023-11-05 DIAGNOSIS — Z7901 Long term (current) use of anticoagulants: Secondary | ICD-10-CM | POA: Diagnosis not present

## 2023-11-05 LAB — CBC
HCT: 38.9 % (ref 36.0–46.0)
Hemoglobin: 12.9 g/dL (ref 12.0–15.0)
MCH: 33.6 pg (ref 26.0–34.0)
MCHC: 33.2 g/dL (ref 30.0–36.0)
MCV: 101.3 fL — ABNORMAL HIGH (ref 80.0–100.0)
Platelets: 215 K/uL (ref 150–400)
RBC: 3.84 MIL/uL — ABNORMAL LOW (ref 3.87–5.11)
RDW: 14 % (ref 11.5–15.5)
WBC: 7.1 K/uL (ref 4.0–10.5)
nRBC: 0 % (ref 0.0–0.2)

## 2023-11-05 LAB — BASIC METABOLIC PANEL WITH GFR
Anion gap: 8 (ref 5–15)
BUN: 18 mg/dL (ref 8–23)
CO2: 23 mmol/L (ref 22–32)
Calcium: 8.2 mg/dL — ABNORMAL LOW (ref 8.9–10.3)
Chloride: 108 mmol/L (ref 98–111)
Creatinine, Ser: 0.93 mg/dL (ref 0.44–1.00)
GFR, Estimated: 59 mL/min — ABNORMAL LOW (ref >60)
Glucose, Bld: 75 mg/dL (ref 70–99)
Potassium: 3.2 mmol/L — ABNORMAL LOW (ref 3.5–5.1)
Sodium: 139 mmol/L (ref 135–145)

## 2023-11-05 MED ORDER — AMIODARONE HCL 200 MG PO TABS
200.0000 mg | ORAL_TABLET | Freq: Two times a day (BID) | ORAL | Status: DC
  Administered 2023-11-05 – 2023-11-06 (×3): 200 mg via ORAL
  Filled 2023-11-05 (×3): qty 1

## 2023-11-05 MED ORDER — POTASSIUM CHLORIDE CRYS ER 20 MEQ PO TBCR
40.0000 meq | EXTENDED_RELEASE_TABLET | ORAL | Status: AC
Start: 2023-11-05 — End: 2023-11-05
  Administered 2023-11-05 (×2): 40 meq via ORAL
  Filled 2023-11-05 (×2): qty 2

## 2023-11-05 MED ORDER — FUROSEMIDE 10 MG/ML IJ SOLN
INTRAMUSCULAR | Status: AC
  Administered 2023-11-05: 40 mg via INTRAVENOUS
  Filled 2023-11-05: qty 4

## 2023-11-05 NOTE — Care Management Obs Status (Signed)
 MEDICARE OBSERVATION STATUS NOTIFICATION   Patient Details  Name: Bethany Villegas MRN: [id number] Date of Birth: 08/31/1934   Medicare Observation Status Notification Given:  Yes    Lucie Lunger, LCSWA 11/05/2023, 9:27 AM

## 2023-11-05 NOTE — Plan of Care (Signed)
  Problem: Health Behavior/Discharge Planning: Goal: Ability to manage health-related needs will improve Outcome: Progressing   Problem: Clinical Measurements: Goal: Ability to maintain clinical measurements within normal limits will improve Outcome: Progressing Goal: Will remain free from infection Outcome: Progressing Goal: Diagnostic test results will improve Outcome: Progressing Goal: Cardiovascular complication will be avoided Outcome: Progressing   Problem: Coping: Goal: Level of anxiety will decrease Outcome: Progressing   Problem: Elimination: Goal: Will not experience complications related to urinary retention Outcome: Progressing   Problem: Pain Managment: Goal: General experience of comfort will improve and/or be controlled Outcome: Progressing   Problem: Safety: Goal: Ability to remain free from injury will improve Outcome: Progressing   Problem: Skin Integrity: Goal: Risk for impaired skin integrity will decrease Outcome: Progressing   Problem: Education: Goal: Ability to demonstrate management of disease process will improve Outcome: Progressing Goal: Ability to verbalize understanding of medication therapies will improve Outcome: Progressing   Problem: Cardiac: Goal: Ability to achieve and maintain adequate cardiopulmonary perfusion will improve Outcome: Progressing

## 2023-11-05 NOTE — Progress Notes (Signed)
 PROGRESS NOTE  Bethany Villegas, is a 89 y.o. female, DOB - 29-Oct-1934, FMW:[id number]  Admit date - 11/04/2023   Admitting Physician Ejiroghene FORBES Carwin, MD  Outpatient Primary MD for the patient is Rosamond [name], MD  LOS - 0  Chief Complaint  Patient presents with   Shortness of Breath      Brief Narrative:  89 y.o. female with medical history significant for atrial fibrillation, congestive heart admitted on 11/04/2022 with acute on chronic systolic dysfunction CHF in the setting of A-fib with RVR    -Assessment and Plan: 1)Acute on chronic systolic (congestive) heart failure (HCC) Dyspnea on exertion, orthopnea, abdominal bloating, 5lb weight gain.  No lower extremity swelling.  BNP elevated at 3127.  Chest x-ray with mild pulmonary edema.  Not hypoxic.  Last echo 09/29/2023 EF of 40 to 45%, moderate mitral and tricuspid regurgitation.  On Lasix  20 mg as needed.  Only took 1 dose in the past 2 weeks PTA -Soft BP limits ability to titrate metoprolol  - Patient received IV Lasix  - Additional IV Lasix  on 11/05/2023 and will reevaluate medical therapy has been limited by soft bp's. Has been on toprol  50mg  bid, jardiance  10mg . Has not been on ACE/ARB/ARNI/MRA due to soft bp's.   2)Persistent atrial fibrillation (HCC) -Control remains challenging despite Toprol , soft BP limits ability to titrate Toprol  aggressively - Cardiology consult appreciated recommends starting oral amiodarone  - 200mg  bid x 3 weeks then 200mg  daily. Can continue toprol  50mg  bid, likely downtitrate pending rates and rhythm - continue eliquis  for stroke prevention  Graves disease -Continue atorvastatin  Status is: Inpatient   Disposition: The patient is from: Home              Anticipated d/c is to: Home              Anticipated d/c date is: 1 day              Patient currently is not medically stable to d/c. Barriers: Not Clinically Stable-   Code Status :  -  Code Status: Full Code   Family Communication:     NA (patient is alert, awake and coherent)   DVT Prophylaxis  :   - SCDs  apixaban  (ELIQUIS ) tablet 2.5 mg Start: 11/04/23 2200 apixaban  (ELIQUIS ) tablet 2.5 mg   Lab Results  Component Value Date   PLT 215 11/05/2023    Inpatient Medications  Scheduled Meds:  amiodarone   200 mg Oral BID   apixaban   2.5 mg Oral BID   levothyroxine   75 mcg Oral Daily   metoprolol  succinate  50 mg Oral BID   Continuous Infusions: PRN Meds:.acetaminophen  **OR** acetaminophen , metoprolol  tartrate, ondansetron  **OR** ondansetron  (ZOFRAN ) IV, polyethylene glycol   Anti-infectives (From admission, onward)    None         Subjective: [name] today has no fevers, no emesis,  No chest pain,    - Very tachycardic --heart rate up to 160s with ambulation around the room - Patient is symptomatic with dizziness - BP remains soft   Objective: Vitals:   11/05/23 0310 11/05/23 0314 11/05/23 1024 11/05/23 1600  BP: 122/74  127/86 92/62  Pulse: 80  (!) 110 90  Resp: 18   20  Temp: 98.2 F (36.8 C)   98.3 F (36.8 C)  TempSrc: Oral   Oral  SpO2: 96%   98%  Weight:  48.9 kg    Height:        Intake/Output Summary (Last 24  hours) at 11/05/2023 1936 Last data filed at 11/05/2023 1802 Gross per 24 hour  Intake 720 ml  Output 1250 ml  Net -530 ml   Filed Weights   11/04/23 1400 11/05/23 0314  Weight: 51.7 kg 48.9 kg    Physical Exam  Gen:- Awake Alert, no acute distress HEENT:- [place].AT, No sclera icterus Neck-Supple Neck,No JVD,.  Lungs-air movement is fair no significant wheezing, no significant rales CV- S1, S2 normal, irregularly irregular and tachycardic  abd-  +ve B.Sounds, Abd Soft, No tenderness,    Extremity/Skin:- No  edema, pedal pulses present  Psych-affect is appropriate, oriented x3 Neuro-no new focal deficits, no tremors  Data Reviewed: I have personally reviewed following labs and imaging studies  CBC: Recent Labs  Lab 11/04/23 1431 11/05/23 0309  WBC 7.4 7.1   NEUTROABS 4.7  --   HGB 14.3 12.9  HCT 43.4 38.9  MCV 104.3* 101.3*  PLT 229 215   Basic Metabolic Panel: Recent Labs  Lab 11/04/23 1431 11/05/23 0309  NA 140 139  K 4.0 3.2*  CL 107 108  CO2 22 23  GLUCOSE 120* 75  BUN 18 18  CREATININE 1.06* 0.93  CALCIUM  8.9 8.2*   GFR: Estimated Creatinine Clearance: 32.2 mL/min (by C-G formula based on SCr of 0.93 mg/dL). Liver Function Tests: Recent Labs  Lab 11/04/23 1431  AST 61*  ALT 58*  ALKPHOS 76  BILITOT 2.7*  PROT 6.8  ALBUMIN 3.8    Radiology Studies: DG Chest Port 1 View Result Date: 11/04/2023 EXAM: 1 VIEW XRAY OF THE CHEST 11/04/2023 02:28:00 PM COMPARISON: 10/01/2023 CLINICAL HISTORY: SOB, CHF. Per triage; Pt arrived via POV c/o worsening SOB, 5lb weight gain and reports fluid retention. Pt reports symptoms started getting worse yesterday. FINDINGS: LUNGS AND PLEURA: Mild pulmonary edema. Interstitial coarsening. Trace right pleural effusion. HEART AND MEDIASTINUM: Stable cardiomegaly. Aortic atherosclerotic calcification. BONES AND SOFT TISSUES: No acute osseous abnormality. IMPRESSION: 1. Mild pulmonary edema with interstitial coarsening. 2. Trace right pleural effusion. 3. Stable cardiomegaly. 4. Aortic atherosclerotic calcification. Electronically signed by: Waddell Calk MD 11/04/2023 02:45 PM EDT RP Workstation: [id number]    Scheduled Meds:  amiodarone   200 mg Oral BID   apixaban   2.5 mg Oral BID   levothyroxine   75 mcg Oral Daily   metoprolol  succinate  50 mg Oral BID   Continuous Infusions:   LOS: 0 days   Rendall Carwin M.D on 11/05/2023 at 7:36 PM  Go to [email or web address] - for contact info  [facility] - Office  [phone]  If 7PM-7AM, please contact night-coverage [email or web address] 11/05/2023, 7:36 PM

## 2023-11-05 NOTE — Progress Notes (Signed)
 Pts BMI is 20.37, does not meet criteria for [place]. Provider notified to DC order.

## 2023-11-05 NOTE — TOC CM/SW Note (Signed)
 Transition of Care [facility]) - Inpatient Brief Assessment   Patient Details  Name: Bethany Villegas MRN: [id number] Date of Birth: Apr 12, 1934  Transition of Care [facility]) CM/SW Contact:    Lucie Lunger, LCSWA Phone Number: 11/05/2023, 9:30 AM   Clinical Narrative: Transition of Care Department [facility]) has reviewed patient and no TOC needs have been identified at this time. We will continue to monitor patient advancement through interdiciplinary progression rounds. If new patient transition needs arise, please place a TOC consult.  Transition of Care Asessment: Insurance and Status: Insurance coverage has been reviewed Patient has primary care physician: Yes Home environment has been reviewed: From home Prior level of function:: Independent Prior/Current Home Services: No current home services Social Drivers of Health Review: SDOH reviewed no interventions necessary Readmission risk has been reviewed: Yes Transition of care needs: no transition of care needs at this time

## 2023-11-05 NOTE — Consult Note (Signed)
 Cardiology Consultation   Patient ID: Bethany Villegas MRN: [id number]; DOB: [birth year]  Admit date: 11/04/2023 Date of Consult: 11/05/2023  PCP:  Bethany [name], MD   [facility] Providers Cardiologist:  Vishnu P Mallipeddi, MD     Patient Profile: Bethany Villegas is a 89 y.o. female with a hx of PAF, chronic HFmrEF who is being seen 11/05/2023 for the evaluation of SOB at the request of Dr Pearlean.  History of Present Illness: Bethany Villegas 89 yo female history of afib, graves disease, chronic HFmrEF, presents with progressive SOB and palpitations at home. Recent admission 09/2023 for decompensated HF and afib with RVR. Reports compliant with meds. Toprol  has been titrated over last few weeks due to ongoing afib with elevated rates as outpatient.    WBC 7.4 Hgb 14.3 Plt 229 K 4 BUN Cr 1.06 BNP 3127 EKG afib with RVR CXR mild edema 09/2023 echo: LVE 40-45%, indet diastolic function, mild RV dysfunction    Past Medical History:  Diagnosis Date   A-fib (HCC)    GERD (gastroesophageal reflux disease)    Graves disease    Hypothyroid    Kidney stones     Past Surgical History:  Procedure Laterality Date   ENDOVENOUS ABLATION SAPHENOUS VEIN W/ LASER Left 11/08/2019   endovenous laser ablation L GSV and stab phlebectomy 10-20 incisions left leg by Carlin Haddock MD    ESOPHAGOGASTRODUODENOSCOPY N/A 08/23/2015   Procedure: ESOPHAGOGASTRODUODENOSCOPY (EGD);  Surgeon: Claudis RAYMOND Rivet, MD;  [facility]: [facility];  Service: Endoscopy;  Laterality: N/A;  235-moved to 220 Ann to notify pt   TOTAL ABDOMINAL HYSTERECTOMY       Scheduled Meds:  apixaban   2.5 mg Oral BID   furosemide        furosemide   40 mg Intravenous Once   levothyroxine   75 mcg Oral Daily   metoprolol  succinate  50 mg Oral BID   potassium chloride   40 mEq Oral Q3H   Continuous Infusions:  PRN Meds: acetaminophen  **OR** acetaminophen , furosemide , metoprolol  tartrate, ondansetron  **OR** ondansetron   (ZOFRAN ) IV, polyethylene glycol  Allergies:    Allergies  Allergen Reactions   Penicillins Swelling and Rash    Arm swelled   Sulfa Antibiotics Rash    Social History:   Social History   Socioeconomic History   Marital status: Single    Spouse name: Not on file   Number of children: Not on file   Years of education: Not on file   Highest education level: Not on file  Occupational History   Not on file  Tobacco Use   Smoking status: Never    Passive exposure: Never   Smokeless tobacco: Never  Vaping Use   Vaping status: Never Used  Substance and Sexual Activity   Alcohol  use: No    Alcohol /week: 0.0 standard drinks of alcohol    Drug use: No   Sexual activity: Not on file  Other Topics Concern   Not on file  Social History Narrative   Not on file   Social Drivers of Health   Financial Resource Strain: Not on file  Food Insecurity: No Food Insecurity (11/04/2023)   Hunger Vital Sign    Worried About Running Out of Food in the Last Year: Never true    Ran Out of Food in the Last Year: Never true  Transportation Needs: No Transportation Needs (11/04/2023)   PRAPARE - Administrator, Civil Service (Medical): No    Lack of Transportation (Non-Medical): No  Physical Activity: Not on file  Stress: Not on file  Social Connections: Moderately Isolated (11/04/2023)   Social Connection and Isolation Panel    Frequency of Communication with Friends and Family: More than three times a week    Frequency of Social Gatherings with Friends and Family: More than three times a week    Attends Religious Services: More than 4 times per year    Active Member of [employer] or Organizations: No    Attends Banker Meetings: Never    Marital Status: Widowed  Intimate Partner Violence: Not At Risk (11/04/2023)   Humiliation, Afraid, Rape, and Kick questionnaire    Fear of Current or Ex-Partner: No    Emotionally Abused: No    Physically Abused: No    Sexually Abused:  No    Family History:   History reviewed. No pertinent family history.   ROS:  Please see the history of present illness.   All other ROS reviewed and negative.     Physical Exam/Data: Vitals:   11/04/23 2232 11/04/23 2354 11/05/23 0310 11/05/23 0314  BP: 107/75 116/63 122/74   Pulse: 77 82 80   Resp:   18   Temp:  98.5 F (36.9 C) 98.2 F (36.8 C)   TempSrc:  Oral Oral   SpO2:  94% 96%   Weight:    48.9 kg  Height:        Intake/Output Summary (Last 24 hours) at 11/05/2023 1002 Last data filed at 11/05/2023 0950 Gross per 24 hour  Intake 480 ml  Output --  Net 480 ml      11/05/2023    3:14 AM 11/04/2023    2:00 PM 10/20/2023    2:22 PM  Last 3 Weights  Weight (lbs) 107 lb 12.9 oz 114 lb 109 lb  Weight (kg) 48.9 kg 51.71 kg 49.442 kg     Body mass index is 20.37 kg/m.  General:  Well nourished, well developed, in no acute distress HEENT: normal Neck: + JVD Vascular: No carotid bruits; Distal pulses 2+ bilaterally Cardiac:  irreg Lungs:  crackles bilaterally Abd: soft, nontender, no hepatomegaly  Ext: no edema Musculoskeletal:  No deformities, BUE and BLE strength normal and equal Skin: warm and dry  Neuro:  CNs 2-12 intact, no focal abnormalities noted Psych:  Normal affect     Laboratory Data: High Sensitivity Troponin:   Recent Labs  Lab 11/04/23 1431 11/04/23 1700  TROPONINIHS 16 17     Chemistry Recent Labs  Lab 11/04/23 1431 11/05/23 0309  NA 140 139  K 4.0 3.2*  CL 107 108  CO2 22 23  GLUCOSE 120* 75  BUN 18 18  CREATININE 1.06* 0.93  CALCIUM  8.9 8.2*  GFRNONAA 51* 59*  ANIONGAP 11 8    Recent Labs  Lab 11/04/23 1431  PROT 6.8  ALBUMIN 3.8  AST 61*  ALT 58*  ALKPHOS 76  BILITOT 2.7*   Lipids No results for input(s): CHOL, TRIG, HDL, LABVLDL, LDLCALC, CHOLHDL in the last 168 hours.  Hematology Recent Labs  Lab 11/04/23 1431 11/05/23 0309  WBC 7.4 7.1  RBC 4.16 3.84*  HGB 14.3 12.9  HCT 43.4 38.9  MCV  104.3* 101.3*  MCH 34.4* 33.6  MCHC 32.9 33.2  RDW 14.2 14.0  PLT 229 215   Thyroid  No results for input(s): TSH, FREET4 in the last 168 hours.  BNP Recent Labs  Lab 11/04/23 1431  BNP 3,127.0*    DDimer No results for input(s):  DDIMER in the last 168 hours.  Radiology/Studies:  DG Chest Port 1 View Result Date: 11/04/2023 EXAM: 1 VIEW XRAY OF THE CHEST 11/04/2023 02:28:00 PM COMPARISON: 10/01/2023 CLINICAL HISTORY: SOB, CHF. Per triage; Pt arrived via POV c/o worsening SOB, 5lb weight gain and reports fluid retention. Pt reports symptoms started getting worse yesterday. FINDINGS: LUNGS AND PLEURA: Mild pulmonary edema. Interstitial coarsening. Trace right pleural effusion. HEART AND MEDIASTINUM: Stable cardiomegaly. Aortic atherosclerotic calcification. BONES AND SOFT TISSUES: No acute osseous abnormality. IMPRESSION: 1. Mild pulmonary edema with interstitial coarsening. 2. Trace right pleural effusion. 3. Stable cardiomegaly. 4. Aortic atherosclerotic calcification. Electronically signed by: Waddell Calk MD 11/04/2023 02:45 PM EDT RP Workstation: [id number]     Assessment and Plan:  1.Acute on chronic HFmrEF - 09/2023 echo: LVEF 40-45%, indet diastolic function, mild RV dysfunction - BNP 3127, CXR mild edema - received IV lasix  40mg  x 1 yesterday, due for 40mg  today. Incomplete I/Os. If weights accurate 114-->107 lbs. 09/2023 discharge weight was 112 lbs. Downtrend in Cr with diuresis consistent with venous congestion and HF.  -remains fluid overloaded by exam, agree with IV lasix  40mg  today.   - medical therapy has been limited by soft bp's. Has been on toprol  50mg  bid, jardiance  10mg . Has not been on ACE/ARB/ARNI/MRA due to soft bp's.  - likely etiology for her cardiomyopathy and recurrent exacerbations is uncontrolled afib   2.AFib, persistent - afib diagnosed 03/2023 by pcp. Initially PAF in and out of afib on her own - last SR by EKG 5/16, looks to have been persistent  afib since that time. Frequent high rates as outpatient, topol has been titrated over the last few weeks.  - has been on toprol  50mg  bid for rate control with recent tiration last week, has failed to control rates.  - last nights toprol  held due to soft bp's SBP in the 90s.   - essentially failing rate control with toprol , not able to tolerate higher doses due to soft bp's. Probable tachy mediated CM with recurrent HF exacerbations/admissions related to uncontrolled afib - she is reluctant to consider cardioversion - given advanced age, systolic HF, persistent afib failing rate control would start oral amiodarone  200mg  bid x 3 weeks then 200mg  daily. Can continue toprol  50mg  bid, likely downtitrate pending rates and rhythm - continue eliquis  for stroke prevention    Risk Assessment/Risk Scores:       New [place]  Heart Association (NYHA) Functional Class NYHA Class III  CHA2DS2-VASc Score = 4   This indicates a 4.8% annual risk of stroke. The patient's score is based upon: CHF History: 1 HTN History: 0 Diabetes History: 0 Stroke History: 0 Vascular Disease History: 0 Age Score: 2 Gender Score: 1        For questions or updates, please contact [facility] Please consult [email or web address] for contact info under      Signed, Alvan Carrier, MD  11/05/2023 10:02 AM

## 2023-11-06 DIAGNOSIS — I5023 Acute on chronic systolic (congestive) heart failure: Secondary | ICD-10-CM | POA: Diagnosis not present

## 2023-11-06 LAB — BASIC METABOLIC PANEL WITH GFR
Anion gap: 8 (ref 5–15)
BUN: 16 mg/dL (ref 8–23)
CO2: 24 mmol/L (ref 22–32)
Calcium: 8.7 mg/dL — ABNORMAL LOW (ref 8.9–10.3)
Chloride: 105 mmol/L (ref 98–111)
Creatinine, Ser: 1.1 mg/dL — ABNORMAL HIGH (ref 0.44–1.00)
GFR, Estimated: 49 mL/min — ABNORMAL LOW (ref >60)
Glucose, Bld: 105 mg/dL — ABNORMAL HIGH (ref 70–99)
Potassium: 4.7 mmol/L (ref 3.5–5.1)
Sodium: 137 mmol/L (ref 135–145)

## 2023-11-06 LAB — MAGNESIUM: Magnesium: 2.1 mg/dL (ref 1.7–2.4)

## 2023-11-06 MED ORDER — SYNTHROID 75 MCG PO TABS: 75.0000 ug | ORAL_TABLET | Freq: Every day | ORAL | 3 refills | Status: AC

## 2023-11-06 MED ORDER — FUROSEMIDE 20 MG PO TABS: 20.0000 mg | ORAL_TABLET | Freq: Every day | ORAL | 1 refills | Status: AC | PRN

## 2023-11-06 MED ORDER — EMPAGLIFLOZIN 10 MG PO TABS: 10.0000 mg | ORAL_TABLET | Freq: Every day | ORAL | 5 refills | Status: AC

## 2023-11-06 MED ORDER — AMIODARONE HCL 200 MG PO TABS: 200.0000 mg | ORAL_TABLET | ORAL | 1 refills | Status: DC

## 2023-11-06 MED ORDER — METOPROLOL SUCCINATE ER 50 MG PO TB24: 25.0000 mg | ORAL_TABLET | Freq: Two times a day (BID) | ORAL | 5 refills | Status: DC

## 2023-11-06 MED ORDER — ACETAMINOPHEN 325 MG PO TABS: 650.0000 mg | ORAL_TABLET | Freq: Four times a day (QID) | ORAL | Status: AC | PRN

## 2023-11-06 MED ORDER — APIXABAN 2.5 MG PO TABS: 2.5000 mg | ORAL_TABLET | Freq: Two times a day (BID) | ORAL | 5 refills | Status: AC

## 2023-11-06 NOTE — Progress Notes (Signed)
 Pt converted to SB 50s, VSS, asymptomatic.  Provider notified.  Will continue to monitor.

## 2023-11-06 NOTE — Plan of Care (Signed)

## 2023-11-06 NOTE — Discharge Instructions (Addendum)
 1)Very Low-salt diet advised---Less than 2 gm of Sodium per day advised----ok to use Mrs DASH salt substitute instead of Salt 2)Weigh yourself daily, call if you gain more than 3 pounds in 1 day or more than 5 pounds in 1 week as your diuretic medications may need to be adjusted 3)Limit your Fluid  intake to no more than 60 ounces (1.8 Liters) per day 4)Avoid ibuprofen/Advil/Aleve/Motrin/Goody Powders/Naproxen/BC powders/Meloxicam/Diclofenac/Indomethacin and other Nonsteroidal anti-inflammatory medications as these will make you more likely to bleed and can cause stomach ulcers, can also cause Kidney problems.  5)Watch for bleeding while on Blood Thinners--watch for blood in your stool which can make your stool black, maroon, mahogany or red---, blood in your urine which can make your urine pink or red, nosebleeds , also watch for possible bruising -You are taking Apixaban /Eliquis --- which is a blood thinner--- be careful to avoid injury or falls 6)Take Amiodarone  200mg  twice daily x 3 weeks then 200mg  once daily after that to help regulate your heart rate and rhythm 7) please decrease metoprolol  to 25 mg twice daily from 50 mg 8)TSH will have to be monitored closely while on amiodarone --have PCP (Vyas, Dhruv B, MD) check TSH every 4 to 6 weeks 9) follow-up with cardiologist Dr. Mallipeddi in a couple of weeks for recheck and reevaluation

## 2023-11-06 NOTE — Discharge Summary (Signed)
 Bethany Villegas, is a 89 y.o. female  DOB Jun 19, 1934  MRN [id number].  Admission date:  11/04/2023  Admitting Physician  Tully FORBES Carwin, MD  Discharge Date:  11/06/2023   Primary MD  Rosamond [name], MD  Recommendations for primary care physician for things to follow:  1)Very Low-salt diet advised---Less than 2 gm of Sodium per day advised----ok to use Mrs DASH salt substitute instead of Salt 2)Weigh yourself daily, call if you gain more than 3 pounds in 1 day or more than 5 pounds in 1 week as your diuretic medications may need to be adjusted 3)Limit your Fluid  intake to no more than 60 ounces (1.8 Liters) per day 4)Avoid ibuprofen/Advil/Aleve/Motrin/Goody Powders/Naproxen/BC powders/Meloxicam/Diclofenac/Indomethacin and other Nonsteroidal anti-inflammatory medications as these will make you more likely to bleed and can cause stomach ulcers, can also cause Kidney problems.  5)Watch for bleeding while on Blood Thinners--watch for blood in your stool which can make your stool black, maroon, mahogany or red---, blood in your urine which can make your urine pink or red, nosebleeds , also watch for possible bruising -You are taking Apixaban /Eliquis --- which is a blood thinner--- be careful to avoid injury or falls 6)Take Amiodarone  200mg  twice daily x 3 weeks then 200mg  once daily after that to help regulate your heart rate and rhythm 7) please decrease metoprolol  to 25 mg twice daily from 50 mg 8)TSH will have to be monitored closely while on amiodarone --have PCP (Vyas, Dhruv B, MD) check TSH every 4 to 6 weeks 9) follow-up with cardiologist Dr. Mallipeddi in a couple of weeks for recheck and reevaluation  Admission Diagnosis  Persistent atrial fibrillation (HCC) [I48.19] Acute on chronic systolic (congestive) heart failure (HCC) [I50.23] Acute on chronic congestive heart failure, unspecified heart failure type  (HCC) [I50.9]   Discharge Diagnosis  Persistent atrial fibrillation (HCC) [I48.19] Acute on chronic systolic (congestive) heart failure (HCC) [I50.23] Acute on chronic congestive heart failure, unspecified heart failure type (HCC) [I50.9]  ***  Principal Problem:   Acute on chronic systolic (congestive) heart failure (HCC) Active Problems:   Current use of long term anticoagulation   Paroxysmal atrial fibrillation (HCC)   Graves disease      Past Medical History:  Diagnosis Date   A-fib (HCC)    GERD (gastroesophageal reflux disease)    Graves disease    Hypothyroid    Kidney stones     Past Surgical History:  Procedure Laterality Date   ENDOVENOUS ABLATION SAPHENOUS VEIN W/ LASER Left 11/08/2019   endovenous laser ablation L GSV and stab phlebectomy 10-20 incisions left leg by Carlin Haddock MD    ESOPHAGOGASTRODUODENOSCOPY N/A 08/23/2015   Procedure: ESOPHAGOGASTRODUODENOSCOPY (EGD);  Surgeon: Claudis RAYMOND Rivet, MD;  [facility];  Service: Endoscopy;  Laterality: N/A;  235-moved to 220 Ann to notify pt   TOTAL ABDOMINAL HYSTERECTOMY         HPI  from the history and physical done on the day of admission:  Chief Complaint: Difficulty breathing   HPI: Bethany Neuberger  Villegas is a 89 y.o. female with medical history significant for atrial fibrillation, congestive heart. Patient presented to the ED with complaints of difficulty breathing with minimal exertion, abdominal bloating and weight gain.  Symptoms have been ongoing for a while, but significantly worsened yesterday.  She has had to sleep sitting up.  She is on Lasix  as needed, she has taken just 1 dose- 2 weeks ago, since she was discharged from the hospital.  At the time she had difficulty breathing and weight gain, and the Lasix  helped.  She reports some mild left-sided chest pain yesterday that resolved spontaneously, she was resting when it started.  No lower extremity swelling.  She has been compliant with Eliquis .   She went to her cardiologist clinic today, she was told she had gained 5 pounds, she was referred to the ED.   Last hospitalization 8/12 to 8/16 for acute CHF exacerbation.  BNP was 1277.  Echo showed EF of 40 to 45%, with moderate tricuspid and mitral regurgitation   ED Course: Temperature 97.5.  Heart rate 98-111.  Respiratory rate 20-22.  Blood pressure systolic 108-129.  O2 sats 95% on room air. BNP elevated at 3127. Troponin 16. Chest x-ray showed mild pulmonary edema with interstitial coarsening, trace right pleural effusion. IV Lasix  40 mg x 1.  Metoprolol  5 mg x 1 given. EDP talked to cardiologist Dr. Alvan, will see in consult.   Review of Systems: As per HPI all other systems reviewed and negative.     Hospital Course:     No notes on file  ***** Assessment and Plan: Brief Narrative:  89 y.o. female with medical history significant for atrial fibrillation, congestive heart admitted on 11/04/2022 with acute on chronic systolic dysfunction CHF in the setting of A-fib with RVR     -Assessment and Plan: 1)Acute on chronic systolic (congestive) heart failure (HCC) Dyspnea on exertion, orthopnea, abdominal bloating, 5lb weight gain.  No lower extremity swelling.  BNP elevated at 3127.  Chest x-ray with mild pulmonary edema.  Not hypoxic.  Last echo 09/29/2023 EF of 40 to 45%, moderate mitral and tricuspid regurgitation.  On Lasix  20 mg as needed.  Only took 1 dose in the past 2 weeks PTA -Soft BP limits ability to titrate metoprolol  - Patient received IV Lasix  - Additional IV Lasix  on 11/05/2023 and will reevaluate medical therapy has been limited by soft bp's. Has been on toprol  50mg  bid, jardiance  10mg . Has not been on ACE/ARB/ARNI/MRA due to soft bp's.  -- TSH will have to be monitored closely while on amiodarone    2)Persistent atrial fibrillation (HCC) -Control remains challenging despite Toprol , soft BP limits ability to titrate Toprol  aggressively - Cardiology consult  appreciated recommends starting oral amiodarone  - 200mg  bid x 3 weeks then 200mg  daily. Can continue toprol  50mg  bid, likely downtitrate pending rates and rhythm - continue eliquis  for stroke prevention   Graves disease -Continue atorvastatin* Acute on chronic systolic (congestive) heart failure (HCC) Dyspnea on exertion, orthopnea, abdominal bloating, 5lb weight gain.  No lower extremity swelling.  BNP elevated at 3127.  Chest x-ray with mild pulmonary edema.  Not hypoxic.  Last echo 09/29/2023 EF of 40 to 45%, moderate mitral and tricuspid regurgitation.  On Lasix  20 mg as needed.  Only took 1 dose in the past 2 weeks. -IV Lasix  40 mg x 2, further dosing pending clinical response - Strict input output, daily weight, daily BMP -Trend troponin -Resume metoprolol   Paroxysmal atrial fibrillation (HCC) Currently in atrial fibrillation.  Heart rate 98-111.  She reports compliance with metoprolol  and Eliquis .  5 mg IV metoprolol  given in ED. -Resume both meds - IV metoprolol  5 mg for heart rate greater than 120  Graves disease Resume Synthroid         Discharge Condition: ***  Follow UP   Follow-up Information     Mallipeddi, Vishnu P, MD. Schedule an appointment as soon as possible for a visit in 2 day(s).   Specialties: Cardiology, Internal Medicine Contact information: [address] [place] [address] [phone]                  Consults obtained - ***  Diet and Activity recommendation:  As advised  Discharge Instructions    **** Discharge Instructions     Call MD for:  difficulty breathing, headache or visual disturbances   Complete by: As directed    Call MD for:  persistant dizziness or light-headedness   Complete by: As directed    Call MD for:  persistant nausea and vomiting   Complete by: As directed    Call MD for:  temperature >100.4   Complete by: As directed    Diet - low sodium heart healthy   Complete by: As directed    Discharge  instructions   Complete by: As directed    1)Very Low-salt diet advised---Less than 2 gm of Sodium per day advised----ok to use Mrs DASH salt substitute instead of Salt 2)Weigh yourself daily, call if you gain more than 3 pounds in 1 day or more than 5 pounds in 1 week as your diuretic medications may need to be adjusted 3)Limit your Fluid  intake to no more than 60 ounces (1.8 Liters) per day 4)Avoid ibuprofen/Advil/Aleve/Motrin/Goody Powders/Naproxen/BC powders/Meloxicam/Diclofenac/Indomethacin and other Nonsteroidal anti-inflammatory medications as these will make you more likely to bleed and can cause stomach ulcers, can also cause Kidney problems.  5)Watch for bleeding while on Blood Thinners--watch for blood in your stool which can make your stool black, maroon, mahogany or red---, blood in your urine which can make your urine pink or red, nosebleeds , also watch for possible bruising -You are taking Apixaban /Eliquis --- which is a blood thinner--- be careful to avoid injury or falls 6)Take Amiodarone  200mg  twice daily x 3 weeks then 200mg  once daily after that to help regulate your heart rate and rhythm 7) please decrease metoprolol  to 25 mg twice daily from 50 mg 8)TSH will have to be monitored closely while on amiodarone --have PCP (Vyas, Dhruv B, MD) check TSH every 4 to 6 weeks 9) follow-up with cardiologist Dr. Mallipeddi in a couple of weeks for recheck and reevaluation   Increase activity slowly   Complete by: As directed          Discharge Medications     Allergies as of 11/06/2023       Reactions   Penicillins Swelling, Rash   Arm swelled   Sulfa Antibiotics Rash        Medication List     TAKE these medications    acetaminophen  325 MG tablet Commonly known as: TYLENOL  Take 2 tablets (650 mg total) by mouth every 6 (six) hours as needed for mild pain (pain score 1-3) or fever (or Fever >/= 101).   amiodarone  200 MG tablet Commonly known as: PACERONE  Take 1  tablet (200 mg total) by mouth See admin instructions. 200mg  bid x 3 weeks then 200mg  daily.   apixaban  2.5 MG Tabs tablet Commonly known as: Eliquis  Take 1  tablet (2.5 mg total) by mouth 2 (two) times daily.   empagliflozin  10 MG Tabs tablet Commonly known as: JARDIANCE  Take 1 tablet (10 mg total) by mouth daily.   furosemide  20 MG tablet Commonly known as: LASIX  Take 1 tablet (20 mg total) by mouth daily as needed for edema or fluid (wieght gain 2 to 3 lbs in 24 hrs or 5 lbs in 7 days).   metoprolol  succinate 50 MG 24 hr tablet Commonly known as: TOPROL -XL Take 0.5 tablets (25 mg total) by mouth in the morning and at bedtime. Take Metoprolol  succinate 50 mg tablet in the AM, 50 mg tablet in the PM. What changed:  how much to take how to take this when to take this   multivitamin-iron-minerals-folic acid chewable tablet Chew 1 tablet by mouth daily.   potassium chloride  SA 20 MEQ tablet Commonly known as: KLOR-CON  M Take 1 tablet (20 mEq total) by mouth daily.   Synthroid  75 MCG tablet Generic drug: levothyroxine  Take 1 tablet (75 mcg total) by mouth daily.   vitamin B-12 250 MCG tablet Commonly known as: CYANOCOBALAMIN  Take 250 mcg by mouth daily.        Major procedures and Radiology Reports - PLEASE review detailed and final reports for all details, in brief -   ***  DG Chest Port 1 View Result Date: 11/04/2023 EXAM: 1 VIEW XRAY OF THE CHEST 11/04/2023 02:28:00 PM COMPARISON: 10/01/2023 CLINICAL HISTORY: SOB, CHF. Per triage; Pt arrived via POV c/o worsening SOB, 5lb weight gain and reports fluid retention. Pt reports symptoms started getting worse yesterday. FINDINGS: LUNGS AND PLEURA: Mild pulmonary edema. Interstitial coarsening. Trace right pleural effusion. HEART AND MEDIASTINUM: Stable cardiomegaly. Aortic atherosclerotic calcification. BONES AND SOFT TISSUES: No acute osseous abnormality. IMPRESSION: 1. Mild pulmonary edema with interstitial coarsening. 2.  Trace right pleural effusion. 3. Stable cardiomegaly. 4. Aortic atherosclerotic calcification. Electronically signed by: Waddell Calk MD 11/04/2023 02:45 PM EDT RP Workstation: [id number]    Micro Results   *** No results found for this or any previous visit (from the past 240 hours).  Today   Subjective    Alois Mincer today has no ***          Patient has been seen and examined prior to discharge   Objective   Blood pressure 114/67, pulse (!) 57, temperature 97.8 F (36.6 C), resp. rate 19, height 5' 1 (1.549 m), weight 49.4 kg, SpO2 98%.   Intake/Output Summary (Last 24 hours) at 11/06/2023 1448 Last data filed at 11/06/2023 0900 Gross per 24 hour  Intake 960 ml  Output 1400 ml  Net -440 ml    Exam Gen:- Awake Alert, no acute distress *** HEENT:- [place].AT, No sclera icterus Neck-Supple Neck,No JVD,.  Lungs-  CTAB , good air movement bilaterally CV- S1, S2 normal, regular Abd-  +ve B.Sounds, Abd Soft, No tenderness,    Extremity/Skin:- No  edema,   good pulses Psych-affect is appropriate, oriented x3 Neuro-no new focal deficits, no tremors ***   Data Review   CBC w Diff:  Lab Results  Component Value Date   WBC 7.1 11/05/2023   HGB 12.9 11/05/2023   HCT 38.9 11/05/2023   PLT 215 11/05/2023   LYMPHOPCT 24 11/04/2023   MONOPCT 8 11/04/2023   EOSPCT 3 11/04/2023   BASOPCT 1 11/04/2023    CMP:  Lab Results  Component Value Date   NA 137 11/06/2023   K 4.7 11/06/2023   CL 105 11/06/2023   CO2 24  11/06/2023   BUN 16 11/06/2023   CREATININE 1.10 (H) 11/06/2023   PROT 6.8 11/04/2023   ALBUMIN 3.8 11/04/2023   BILITOT 2.7 (H) 11/04/2023   ALKPHOS 76 11/04/2023   AST 61 (H) 11/04/2023   ALT 58 (H) 11/04/2023  .  Total Discharge time is about 33 minutes  Rendall Carwin M.D on 11/06/2023 at 2:48 PM  Go to [email or web address] -  for contact info  [facility] - Office  [phone]

## 2023-11-12 ENCOUNTER — Other Ambulatory Visit: Payer: Self-pay | Admitting: Family Medicine

## 2023-11-12 ENCOUNTER — Telehealth: Payer: Self-pay | Admitting: Internal Medicine

## 2023-11-12 MED ORDER — METOPROLOL SUCCINATE ER 50 MG PO TB24: 25.0000 mg | ORAL_TABLET | Freq: Two times a day (BID) | ORAL | 5 refills | Status: DC

## 2023-11-12 NOTE — Telephone Encounter (Signed)
 Received FMLA papers from [employer]. Informed patient that we have paper work to be completed  She will come by the office to sign medical release and pay the $29.00. forms scanned into chart.

## 2023-11-12 NOTE — Progress Notes (Unsigned)
 Toprol XL 25 mg bid

## 2023-11-18 ENCOUNTER — Ambulatory Visit: Attending: Nurse Practitioner | Admitting: Nurse Practitioner

## 2023-11-18 ENCOUNTER — Encounter: Payer: Self-pay | Admitting: Nurse Practitioner

## 2023-11-18 ENCOUNTER — Other Ambulatory Visit (HOSPITAL_BASED_OUTPATIENT_CLINIC_OR_DEPARTMENT_OTHER): Payer: Self-pay

## 2023-11-18 VITALS — BP 138/90 | HR 100 | Ht 61.0 in | Wt 109.8 lb

## 2023-11-18 DIAGNOSIS — Z79899 Other long term (current) drug therapy: Secondary | ICD-10-CM | POA: Insufficient documentation

## 2023-11-18 DIAGNOSIS — I5020 Unspecified systolic (congestive) heart failure: Secondary | ICD-10-CM | POA: Diagnosis not present

## 2023-11-18 DIAGNOSIS — I480 Paroxysmal atrial fibrillation: Secondary | ICD-10-CM | POA: Insufficient documentation

## 2023-11-18 DIAGNOSIS — I872 Venous insufficiency (chronic) (peripheral): Secondary | ICD-10-CM | POA: Diagnosis not present

## 2023-11-18 DIAGNOSIS — E039 Hypothyroidism, unspecified: Secondary | ICD-10-CM | POA: Insufficient documentation

## 2023-11-18 DIAGNOSIS — E038 Other specified hypothyroidism: Secondary | ICD-10-CM

## 2023-11-18 MED ORDER — METOPROLOL SUCCINATE ER 50 MG PO TB24
75.0000 mg | ORAL_TABLET | Freq: Two times a day (BID) | ORAL | 1 refills | Status: AC
  Filled 2023-11-18: qty 90, 30d supply, fill #0
  Filled 2023-12-22: qty 90, 30d supply, fill #1
  Filled 2024-01-20: qty 90, 30d supply, fill #2
  Filled 2024-02-18: qty 10, 3d supply, fill #3
  Filled 2024-02-18: qty 80, 27d supply, fill #3
  Filled 2024-03-20: qty 90, 30d supply, fill #4

## 2023-11-18 NOTE — Patient Instructions (Addendum)
 Medication Instructions:  Your physician has recommended you make the following change in your medication:  Increase metoprolol  succinate to 75 mg twice daily Continue all other medications as prescribed  Labwork: Thyroid  Panel w/TSH & LFT's as soon as possible Non-fasting [facility] ([address]. [place]) or [facility]  Testing/Procedures: None Your physician has recommended that you have a Cardioversion (DCCV). Electrical Cardioversion uses a jolt of electricity to your heart either through paddles or wired patches attached to your chest. This is a controlled, usually prescheduled, procedure. Defibrillation is done under light anesthesia in the hospital, and you usually go home the day of the procedure. This is done to get your heart back into a normal rhythm. You are not awake for the procedure. Please see the instruction sheet given to you today. (This is for your information and understanding of a cardioversion). This test has not been ordered.   Follow-Up: Your physician recommends that you schedule a follow-up appointment in: 6-8 weeks  Any Other Special Instructions Will Be Listed Below (If Applicable).  If you need a refill on your cardiac medications before your next appointment, please call your pharmacy.

## 2023-11-18 NOTE — Progress Notes (Signed)
 Cardiology Office Note   Date:  11/18/2023 ID:  Bethany Villegas, DOB [birth year], MRN [id number] PCP: Rosamond [name], MD  [facility] Providers Cardiologist:  Diannah SHAUNNA Maywood, MD     History of Present Illness Bethany Villegas is a 89 y.o. female with a PMH of A-fib, Graves' disease, s/p RAI around 30 years ago, hypothyroidism, history of kidney stones, GERD, presents today for A-fib follow-up.  Diagnosed with new onset A-fib with RVR in her PCPs office in February 2025.  PCP started her on Toprol -XL 25 mg once daily and Eliquis  5 mg twice daily.  Echocardiogram revealed LVEF 50 to 55%, mild MR, mild diastolic dysfunction, aortic root dilatation at 38 cm.  Last seen by Dr. Mallipeddi on 07/02/2023.  She was doing well at the time, asymptomatic regarding her A-fib.  EKG revealed normal sinus rhythm in office.  Very active and independent, working at [employer].  Recent hospitalization due to acute on chronic diastolic CHF, presented with dyspnea.  Respiratory viral panel negative.  BNP 1839.  CXR showed cardiomegaly with bilateral hilar vascular congestion.  Was found to be in A-fib with RVR on arrival.  See echocardiogram noted below-LVEF 40 to 45%, moderate MR, moderate TR.  She received IV Lasix  for diuresis.  Was recommended follow-up outpatient.   Today she presents for hospital follow-up and A-fib follow-up.  She admits to feeling tired/fatigued and does not palpitations. Does admit that she has intermittent shortness of breath, sometimes has to stop what she is doing to catch her breath. Very active and independent at her age. Admits to what sounds like one skipped dose of Eliquis  within the past 3-4 weeks. Denies any chest pain, syncope, presyncope, dizziness, orthopnea, PND, swelling or significant weight changes, acute bleeding, or claudication.  Hospitalized in September 2025 due to acute on chronic CHF exacerbation.  Admitted with DOE, orthopnea, weight gain and abdominal  bloating.  BNP 3127.  Chest x-ray showed mild pulmonary edema.  BPs made medication titration challenging.  Cardiology was consulted, recommended starting oral amiodarone .  Today she presents for hospital follow-up.  She states she sometimes notices palpitations and endorses fatigue.  States she notices palpitations more when she is having emotional upset.  States her symptoms are better now that she is on amiodarone . Denies any chest pain, shortness of breath, syncope, presyncope, dizziness, orthopnea, PND, swelling or significant weight changes, acute bleeding, or claudication.  ROS: Negative. See HPI.   Studies Reviewed  EKG:  EKG Interpretation Date/Time:  Thursday November 18 2023 13:28:23 EDT Ventricular Rate:  97 PR Interval:    QRS Duration:  86 QT Interval:  310 QTC Calculation: 393 R Axis:   76  Text Interpretation: Atrial fibrillation Septal infarct (cited on or before 06-Nov-2023) ST & T wave abnormality, consider lateral ischemia When compared with ECG of 06-Nov-2023 14:39, Atrial fibrillation has replaced Sinus rhythm Nonspecific T wave abnormality now evident in Inferior leads Nonspecific T wave abnormality now evident in Anterior leads QT has shortened Confirmed by Miriam Norris [phone]) on 11/18/2023 1:39:50 PM    Echo 09/2023:  1. Left ventricular ejection fraction, by estimation, is 40 to 45%. The  left ventricle has mildly decreased function. The left ventricle  demonstrates global hypokinesis. There is moderate concentric left  ventricular hypertrophy. Left ventricular  diastolic parameters are indeterminate.   2. Right ventricular systolic function is mildly reduced. The right  ventricular size is normal. There is normal pulmonary artery systolic  pressure. The estimated right ventricular  systolic pressure is 29.7 mmHg.   3. Left pleural effusion noted.   4. The mitral valve is grossly normal. Moderate mitral valve  regurgitation.   5. Tricuspid valve regurgitation  is moderate.   6. The aortic valve is tricuspid. There is mild calcification of the  aortic valve. Aortic valve regurgitation is trivial. Aortic valve  sclerosis is present, with no evidence of aortic valve stenosis.   7. The inferior vena cava is normal in size with <50% respiratory  variability, suggesting right atrial pressure of 8 mmHg.   Comparison(s): Prior images unable to be directly viewed.  Physical Exam VS:  BP (!) 138/90 (BP Location: Left Arm, Patient Position: Sitting, Cuff Size: Normal)   Pulse 100   Ht 5' 1 (1.549 m)   Wt 109 lb 12.8 oz (49.8 kg)   SpO2 96%   BMI 20.75 kg/m        Wt Readings from Last 3 Encounters:  11/18/23 109 lb 12.8 oz (49.8 kg)  11/06/23 108 lb 14.4 oz (49.4 kg)  10/20/23 109 lb (49.4 kg)    GEN: Thin, frail 89 y.o, female in no acute distress NECK: No JVD; No carotid bruits CARDIAC: S1/S2, regular rate and irregular rhythm, no murmurs, rubs, gallops RESPIRATORY:  Clear to auscultation without rales, wheezing or rhonchi  ABDOMEN: Soft, non-tender, non-distended EXTREMITIES:  No edema, varicose veins to BLE with some skin discoloration; No deformity   ASSESSMENT AND PLAN  A-fib, on amiodarone  therapy, medication management Heart rate is on upper end of normal.  She is symptomatic with this and has been compliant with Eliquis  within the past 3 to 4 weeks per her report.  Ran case past her cardiologist, Dr. Mallipeddi, in office, who agreed with cardioversion.  Did discuss risks and benefits of cardioversion, and she states she would like to wait at this time to think about this. Will increase Toprol  XL to 75 mg BID for better rate control. Continue Amiodarone  and continue Eliquis  for stroke prevention.  She is on appropriate dosage given her age and weight. Will obtain thyroid  panel, LFT's, and will provide info on DCCV for educational purposes. Did consult Dr. Mallipeddi who recommended PFT's at baseline - will arrange.  2. HFmrEF Stage C,  NYHA class II-III symptoms. EF 40-45% d/t A-fib with RVR, most likely NICM. Euvolemic and well compensated on exam.  Continue current medication regimen. Low sodium diet, fluid restriction <2L, and daily weights encouraged. Educated to contact our office for weight gain of 2 lbs overnight or 5 lbs in one week.  3. Chronic Venous Insufficiency Noted on exam, no significant edema noted. Discussed care precautions. Continue to follow with PCP.    Dispo: Care and ED precautions discussed. Follow-up with MD/APP in 6-8 weeks or sooner if anything changes.   Signed, Almarie Crate, NP

## 2023-11-19 ENCOUNTER — Other Ambulatory Visit (HOSPITAL_BASED_OUTPATIENT_CLINIC_OR_DEPARTMENT_OTHER): Payer: Self-pay

## 2023-11-22 ENCOUNTER — Ambulatory Visit: Admitting: Nurse Practitioner

## 2023-11-23 ENCOUNTER — Telehealth: Payer: Self-pay | Admitting: *Deleted

## 2023-11-23 ENCOUNTER — Telehealth: Payer: Self-pay | Admitting: Nurse Practitioner

## 2023-11-23 DIAGNOSIS — Z79899 Other long term (current) drug therapy: Secondary | ICD-10-CM

## 2023-11-23 NOTE — Telephone Encounter (Signed)
-----   Message from Almarie Crate sent at 11/22/2023  6:07 PM EDT ----- Consulted Dr. Mallipeddi about this one, recommending PFT's for Amiodarone  therapy. Let's arrange.   Thanks!   Best, Almarie Crate, NP

## 2023-11-23 NOTE — Telephone Encounter (Signed)
 Aware of test and appointment.

## 2023-11-23 NOTE — Telephone Encounter (Signed)
 Checking percert on the following patient for testing scheduled at  [facility]   PFT'S   12/08/2023

## 2023-11-23 NOTE — Telephone Encounter (Signed)
 Patient has been scheduled for 12-08-23 @ [facility] . Zelda Salmon is booked until [facility] December. Mrs. Milks said that she cannot go to [facility]. Can she wait until December for this testing?  Per Almarie Crate, NP that is ok if patient can't get to [facility]. Just spoke back with the patient. She is going to try and get her niiece to take her to [place] if not she will call us  back to re-schedule.

## 2023-11-26 DIAGNOSIS — E2839 Other primary ovarian failure: Secondary | ICD-10-CM | POA: Diagnosis not present

## 2023-11-26 DIAGNOSIS — E039 Hypothyroidism, unspecified: Secondary | ICD-10-CM | POA: Diagnosis not present

## 2023-12-08 ENCOUNTER — Ambulatory Visit (HOSPITAL_COMMUNITY)
Admission: RE | Admit: 2023-12-08 | Discharge: 2023-12-08 | Disposition: A | Discharge status: Home / Self Care | Source: Ambulatory Visit | Attending: Nurse Practitioner | Admitting: Nurse Practitioner

## 2023-12-08 DIAGNOSIS — Z79899 Other long term (current) drug therapy: Secondary | ICD-10-CM | POA: Insufficient documentation

## 2023-12-08 LAB — PULMONARY FUNCTION TEST
DL/VA % pred: 85 %
DL/VA: 3.51 ml/min/mmHg/L
DLCO unc % pred: 50 %
DLCO unc: 8.62 ml/min/mmHg
FEF 25-75 Post: 1.22 L/s
FEF 25-75 Pre: 1.4 L/s
FEF2575-%Change-Post: -13 %
FEF2575-%Pred-Post: 130 %
FEF2575-%Pred-Pre: 150 %
FEV1-%Change-Post: -2 %
FEV1-%Pred-Post: 67 %
FEV1-%Pred-Pre: 69 %
FEV1-Post: 1.01 L
FEV1-Pre: 1.04 L
FEV1FVC-%Change-Post: -6 %
FEV1FVC-%Pred-Pre: 128 %
FEV6-%Change-Post: 2 %
FEV6-%Pred-Post: 59 %
FEV6-%Pred-Pre: 58 %
FEV6-Post: 1.14 L
FEV6-Pre: 1.12 L
FEV6FVC-%Pred-Post: 107 %
FEV6FVC-%Pred-Pre: 107 %
FVC-%Change-Post: 3 %
FVC-%Pred-Post: 56 %
FVC-%Pred-Pre: 54 %
FVC-Post: 1.16 L
FVC-Pre: 1.12 L
Post FEV1/FVC ratio: 87 %
Post FEV6/FVC ratio: 100 %
Pre FEV1/FVC ratio: 93 %
Pre FEV6/FVC Ratio: 100 %
RV % pred: 61 %
RV: 1.5 L
TLC % pred: 55 %
TLC: 2.62 L

## 2023-12-08 MED ORDER — ALBUTEROL SULFATE (2.5 MG/3ML) 0.083% IN NEBU
2.5000 mg | INHALATION_SOLUTION | Freq: Once | RESPIRATORY_TRACT | Status: AC
  Administered 2023-12-08: 2.5 mg via RESPIRATORY_TRACT

## 2023-12-09 ENCOUNTER — Ambulatory Visit: Payer: Self-pay | Admitting: Nurse Practitioner

## 2023-12-14 NOTE — Telephone Encounter (Signed)
 Patient informed and verbalized understanding. Declines DCCV and A-fib referral at this time. Reports she would like to think about her decision and call back when she decides.  Copy sent to PCP

## 2023-12-14 NOTE — Telephone Encounter (Signed)
-----   Message from Vishnu P Mallipeddi sent at 12/09/2023  4:44 PM EDT ----- Stop Amiodarone . Not a candidate for Flecainide or Sotalol. She needs DCCV. If she is hesitant, can send her to Afib clinic for Tikosyn initiation.  ----- Message ----- From: Miriam Norris, NP Sent: 12/09/2023   3:53 PM EDT To: Vishnu P Mallipeddi, MD; Ric[place] Dec  Overall stated she felt well on Amiodarone  when I last saw her in office. Did PFT's that shows she may have some sort of interstitial lung/restrictive lung disease.  Wanted to check with you, Dr.  Mallipeddi, to make sure you are okay with continuing amiodarone . I do not see that she follows Pulmonology and will see if at next visit she will need a referral.  Thanks!  Norris Miriam, AGNP-C ----- Message ----- From: Interface, [facility] Sent: 12/08/2023  11:57 AM EDT To: Norris Miriam, NP

## 2023-12-22 ENCOUNTER — Other Ambulatory Visit (HOSPITAL_BASED_OUTPATIENT_CLINIC_OR_DEPARTMENT_OTHER): Payer: Self-pay

## 2023-12-27 DIAGNOSIS — E039 Hypothyroidism, unspecified: Secondary | ICD-10-CM | POA: Diagnosis not present

## 2024-01-17 ENCOUNTER — Ambulatory Visit: Payer: Self-pay | Admitting: Nurse Practitioner

## 2024-01-18 ENCOUNTER — Ambulatory Visit: Attending: Nurse Practitioner | Admitting: Nurse Practitioner

## 2024-01-18 ENCOUNTER — Encounter: Payer: Self-pay | Admitting: Nurse Practitioner

## 2024-01-18 VITALS — BP 142/62 | HR 61 | Ht 62.5 in | Wt 103.0 lb

## 2024-01-18 DIAGNOSIS — I872 Venous insufficiency (chronic) (peripheral): Secondary | ICD-10-CM | POA: Diagnosis not present

## 2024-01-18 DIAGNOSIS — I480 Paroxysmal atrial fibrillation: Secondary | ICD-10-CM | POA: Insufficient documentation

## 2024-01-18 DIAGNOSIS — R030 Elevated blood-pressure reading, without diagnosis of hypertension: Secondary | ICD-10-CM | POA: Insufficient documentation

## 2024-01-18 DIAGNOSIS — I5020 Unspecified systolic (congestive) heart failure: Secondary | ICD-10-CM | POA: Diagnosis not present

## 2024-01-18 NOTE — Patient Instructions (Signed)

## 2024-01-18 NOTE — Progress Notes (Unsigned)
 Cardiology Office Note   Date:  01/18/2024 ID:  JAYCELYN ORRISON, DOB 11-Feb-1935, MRN [id number] PCP: Rosamond [name], MD  [facility] Providers Cardiologist:  Diannah SHAUNNA Maywood, MD     History of Present Illness JORGINA BINNING is a 89 y.o. female with a PMH of A-fib, Graves' disease, s/p RAI around 30 years ago, hypothyroidism, history of kidney stones, GERD, presents today for A-fib follow-up.  Diagnosed with new onset A-fib with RVR in her PCPs office in February 2025.  PCP started her on Toprol -XL 25 mg once daily and Eliquis  5 mg twice daily.  Echocardiogram revealed LVEF 50 to 55%, mild MR, mild diastolic dysfunction, aortic root dilatation at 38 cm.  Last seen by Dr. Mallipeddi on 07/02/2023.  She was doing well at the time, asymptomatic regarding her A-fib.  EKG revealed normal sinus rhythm in office.  Very active and independent, working at [employer].  Recent hospitalization due to acute on chronic diastolic CHF, presented with dyspnea.  Respiratory viral panel negative.  BNP 1839.  CXR showed cardiomegaly with bilateral hilar vascular congestion.  Was found to be in A-fib with RVR on arrival.  See echocardiogram noted below-LVEF 40 to 45%, moderate MR, moderate TR.  She received IV Lasix  for diuresis.  Was recommended follow-up outpatient.   Today she presents for hospital follow-up and A-fib follow-up.  She admits to feeling tired/fatigued and does not palpitations. Does admit that she has intermittent shortness of breath, sometimes has to stop what she is doing to catch her breath. Very active and independent at her age. Admits to what sounds like one skipped dose of Eliquis  within the past 3-4 weeks. Denies any chest pain, syncope, presyncope, dizziness, orthopnea, PND, swelling or significant weight changes, acute bleeding, or claudication.  Hospitalized in September 2025 due to acute on chronic CHF exacerbation.  Admitted with DOE, orthopnea, weight gain and abdominal  bloating.  BNP 3127.  Chest x-ray showed mild pulmonary edema.  BPs made medication titration challenging.  Cardiology was consulted, recommended starting oral amiodarone .  11/18/2023 - Today she presents for hospital follow-up.  She states she sometimes notices palpitations and endorses fatigue.  States she notices palpitations more when she is having emotional upset.  States her symptoms are better now that she is on amiodarone . Denies any chest pain, shortness of breath, syncope, presyncope, dizziness, orthopnea, PND, swelling or significant weight changes, acute bleeding, or claudication.  01/18/2024 -  Here for follow-up. Doing much better after stopping amiodarone . Denies any chest pain, shortness of breath, palpitations, syncope, presyncope, dizziness, orthopnea, PND, swelling or significant weight changes, acute bleeding, or claudication.  ROS: Negative. See HPI.   Studies Reviewed  EKG: EKG is not ordered today.      Echo 09/2023:  1. Left ventricular ejection fraction, by estimation, is 40 to 45%. The  left ventricle has mildly decreased function. The left ventricle  demonstrates global hypokinesis. There is moderate concentric left  ventricular hypertrophy. Left ventricular  diastolic parameters are indeterminate.   2. Right ventricular systolic function is mildly reduced. The right  ventricular size is normal. There is normal pulmonary artery systolic  pressure. The estimated right ventricular systolic pressure is 29.7 mmHg.   3. Left pleural effusion noted.   4. The mitral valve is grossly normal. Moderate mitral valve  regurgitation.   5. Tricuspid valve regurgitation is moderate.   6. The aortic valve is tricuspid. There is mild calcification of the  aortic valve. Aortic valve regurgitation is  trivial. Aortic valve  sclerosis is present, with no evidence of aortic valve stenosis.   7. The inferior vena cava is normal in size with <50% respiratory  variability, suggesting  right atrial pressure of 8 mmHg.   Comparison(s): Prior images unable to be directly viewed.  Physical Exam VS:  BP (!) 142/62   Pulse 61   Ht 5' 2.5 (1.588 m)   Wt 103 lb (46.7 kg)   SpO2 96%   BMI 18.54 kg/m        Wt Readings from Last 3 Encounters:  01/18/24 103 lb (46.7 kg)  11/18/23 109 lb 12.8 oz (49.8 kg)  11/06/23 108 lb 14.4 oz (49.4 kg)    GEN: Thin, frail 89 y.o, female in no acute distress NECK: No JVD; No carotid bruits CARDIAC: S1/S2, regular rate and regular rhythm, no murmurs, rubs, gallops RESPIRATORY:  Clear to auscultation without rales, wheezing or rhonchi  ABDOMEN: Soft, non-tender, non-distended EXTREMITIES:  No edema, varicose veins to BLE with some skin discoloration; No deformity   ASSESSMENT AND PLAN  A-fib Denies any tachycardia or palpitations.  Doing better now that she is off amiodarone .  Previous PFTs that were ordered showed some underlying restrictive lung disease/interstitial lung disease.  Continue Eliquis  for stroke prevention.  Heart rate is well-controlled and will continue metoprolol  succinate.   Heart healthy diet and regular cardiovascular exercise encouraged.    2. HFmrEF Stage C, NYHA class I-II symptoms. EF 40-45% d/t A-fib with RVR, most likely NICM. Euvolemic and well compensated on exam.  Continue current medication regimen. Low sodium diet, fluid restriction <2L, and daily weights encouraged. Educated to contact our office for weight gain of 2 lbs overnight or 5 lbs in one week.  Will update limited echo to evaluate EF.  3. Chronic Venous Insufficiency Noted on exam, no significant edema noted. Discussed care precautions. Continue to follow with PCP.   4. Elevated blood pressure Her BP readings are elevated today in the office. Discussed to monitor BP at home at least 2 hours after medications and sitting for 5-10 minutes.  Discussed when to notify our office.  No medication changes at this time.   Dispo: Care and ED  precautions discussed. Follow-up with MD/APP in 6 months or sooner if anything changes.   I spent a total duration of 20 minutes reviewing prior notes, reviewing outside records including  labs, face-to-face counseling of medical condition, pathophysiology, evaluation, management, and documenting the findings in the note.   Signed, Almarie Crate, NP

## 2024-01-20 ENCOUNTER — Other Ambulatory Visit (HOSPITAL_BASED_OUTPATIENT_CLINIC_OR_DEPARTMENT_OTHER): Payer: Self-pay

## 2024-02-02 ENCOUNTER — Ambulatory Visit: Attending: Nurse Practitioner

## 2024-02-02 DIAGNOSIS — I5020 Unspecified systolic (congestive) heart failure: Secondary | ICD-10-CM | POA: Diagnosis present

## 2024-02-02 LAB — ECHOCARDIOGRAM LIMITED
AR max vel: 2.59 cm2
AV Peak grad: 3.6 mmHg
AV Vena cont: 0.3 cm
Ao pk vel: 0.95 m/s
Area-P 1/2: 2.45 cm2
Calc EF: 55.5 %
MV M vel: 5.08 m/s
MV Peak grad: 103.2 mmHg
P 1/2 time: 758 ms
S' Lateral: 3.4 cm
Single Plane A2C EF: 53.2 %
Single Plane A4C EF: 51.4 %

## 2024-02-08 ENCOUNTER — Ambulatory Visit: Payer: Self-pay | Admitting: Nurse Practitioner

## 2024-02-08 NOTE — Telephone Encounter (Signed)
 The patient has been notified of the result and verbalized understanding.  All questions (if any) were answered. Littie CHRISTELLA Croak, CMA 02/08/2024 1:18 PM

## 2024-02-08 NOTE — Telephone Encounter (Signed)
-----   Message from Almarie Crate, NP sent at 02/08/2024 11:39 AM EST ----- Heart pumping function has improved and returned to normal.  Mitral valve is mildly leaky.  Aortic valve is mildly leaky.  Otherwise this is a benign report.  I am pleased with this report.  Almarie Crate, AGNP-C

## 2024-02-18 ENCOUNTER — Other Ambulatory Visit (HOSPITAL_BASED_OUTPATIENT_CLINIC_OR_DEPARTMENT_OTHER): Payer: Self-pay

## 2024-03-20 ENCOUNTER — Other Ambulatory Visit (HOSPITAL_BASED_OUTPATIENT_CLINIC_OR_DEPARTMENT_OTHER): Payer: Self-pay
